# Patient Record
Sex: Male | Born: 1955 | Race: White | Hispanic: No | Marital: Single | Smoking: Never smoker
Health system: Southern US, Community
[De-identification: ages and names within clinical notes are randomized; demographics above are authoritative.]

## PROBLEM LIST (undated history)

## (undated) ENCOUNTER — Ambulatory Visit (HOSPITAL_COMMUNITY): Discharge status: Absent without leave | Payer: BLUE CROSS/BLUE SHIELD

## (undated) DIAGNOSIS — E785 Hyperlipidemia, unspecified: Secondary | ICD-10-CM

## (undated) DIAGNOSIS — C61 Malignant neoplasm of prostate: Secondary | ICD-10-CM

## (undated) DIAGNOSIS — T7840XA Allergy, unspecified, initial encounter: Secondary | ICD-10-CM

## (undated) DIAGNOSIS — I10 Essential (primary) hypertension: Secondary | ICD-10-CM

## (undated) HISTORY — DX: Essential (primary) hypertension: I10

## (undated) HISTORY — DX: Hyperlipidemia, unspecified: E78.5

## (undated) HISTORY — DX: Malignant neoplasm of prostate: C61

## (undated) HISTORY — DX: Allergy, unspecified, initial encounter: T78.40XA

---

## 2000-05-29 ENCOUNTER — Emergency Department (HOSPITAL_COMMUNITY): Admission: EM | Admit: 2000-05-29 | Discharge: 2000-05-29 | Payer: Self-pay | Admitting: Emergency Medicine

## 2000-05-29 ENCOUNTER — Encounter: Payer: Self-pay | Admitting: Emergency Medicine

## 2000-05-31 ENCOUNTER — Emergency Department (HOSPITAL_COMMUNITY): Admission: EM | Admit: 2000-05-31 | Discharge: 2000-05-31 | Payer: Self-pay | Admitting: Emergency Medicine

## 2000-06-06 ENCOUNTER — Emergency Department (HOSPITAL_COMMUNITY): Admission: EM | Admit: 2000-06-06 | Discharge: 2000-06-06 | Payer: Self-pay | Admitting: Emergency Medicine

## 2000-06-18 ENCOUNTER — Encounter: Admission: RE | Admit: 2000-06-18 | Discharge: 2000-06-24 | Payer: Self-pay | Admitting: Orthopedic Surgery

## 2008-11-21 ENCOUNTER — Encounter: Admission: RE | Admit: 2008-11-21 | Discharge: 2008-11-21 | Payer: Self-pay | Admitting: *Deleted

## 2010-11-23 ENCOUNTER — Encounter: Payer: Self-pay | Admitting: *Deleted

## 2011-06-22 DIAGNOSIS — C61 Malignant neoplasm of prostate: Secondary | ICD-10-CM

## 2011-06-22 HISTORY — PX: PROSTATE BIOPSY: SHX241

## 2011-06-22 HISTORY — DX: Malignant neoplasm of prostate: C61

## 2013-02-07 HISTORY — PX: PROSTATE BIOPSY: SHX241

## 2013-02-21 ENCOUNTER — Encounter: Payer: Self-pay | Admitting: *Deleted

## 2013-02-21 ENCOUNTER — Encounter: Payer: Self-pay | Admitting: Radiation Oncology

## 2013-02-21 NOTE — Progress Notes (Signed)
Radiation Oncology         [phone] ________________________________  Initial outpatient Consultation  Name: Jose Calderon MRN: [id number]  Date: 02/22/2013  DOB: 11/03/1955  CC:No primary provider on file.  Garnett Farm, MD   REFERRING PHYSICIAN: Garnett Farm, MD  DIAGNOSIS: 57 y.o. gentleman with stage T2a adenocarcinoma of the prostate with a Gleason's score of 3+3 and a PSA of 1.7  HISTORY OF PRESENT ILLNESS::Jose Calderon is a 57 y.o. gentleman.  He was noted to have an elevated PSA of 1.93 in 2010 rising to 2.08 one year later. By August of 2012, his PSA increased to 6.74. In addition to his rising PSA, patient was noted to have firmness in the right side of the gland by his primary care physician, Dr. Selena Batten.  Accordingly, he was referred for evaluation in urology by Dr. Vernie Ammons   The patient proceeded to transrectal ultrasound with 12 biopsies of the prostate on 06/22/2011.  The prostate volume measured 23.4 cc.  Out of 12 core biopsies, a single one was positive.  The Gleason score was 3+3, and this was seen in 5% of the right base.  The patient reviewed the biopsy results with urology and elected to pursue active surveillance.  Followup PSA on 01/02/2013 was 1.7.  The patient proceeded to transrectal ultrasound with surveillance biopsies of the prostate on 02/07/2013.  The prostate volume measured 26.82 cc.  Out of 12 core biopsies, 3 were positive.  The maximum Gleason score was 3+3, and this was seen in 20% of the right lateral base, 5% of the right base, and 5% of the left lateral mid.  The patient reviewed the biopsy results with his urologist and he has kindly been referred today for discussion of potential radiation treatment options.   PREVIOUS RADIATION THERAPY: No  PAST MEDICAL HISTORY:  has a past medical history of Prostate cancer (06/22/11); Hypertension; Hyperlipidemia; and Allergy.    PAST SURGICAL HISTORY: Past Surgical History  Procedure Laterality Date    . Prostate biopsy  06/22/11    Adenocarcinoma  . Prostate biopsy  02/07/13    2nd biopsy=Adenocarcinoma    FAMILY HISTORY: family history includes Cancer in his brother and mother.  SOCIAL HISTORY:  reports that he has never smoked. He has never used smokeless tobacco. He reports that  drinks alcohol. He reports that he does not use illicit drugs.  ALLERGIES: Amlodipine besy-benazepril hcl  MEDICATIONS:  Current Outpatient Prescriptions  Medication Sig Dispense Refill  . valACYclovir (VALTREX) 1000 MG tablet Take 1,000 mg by mouth as needed (cold sores).      Marland Kitchen HYDROXYZINE HCL PO Take 25 mg by mouth as needed (hives).        No current facility-administered medications for this encounter.    REVIEW OF SYSTEMS:  A 15 point review of systems is documented in the electronic medical record. This was obtained by the nursing staff. However, I reviewed this with the patient to discuss relevant findings and make appropriate changes.  A comprehensive review of systems was negative..  The patient completed an IPSS and IIEF questionnaire.  His IPSS score was 5 indicating mild urinary outflow obstructive symptoms.  He indicated that his erectile function is almost always able to complete sexual activity.   PHYSICAL EXAM: This patient is in no acute distress.  He is alert and oriented.   height is 5\' 10"  (1.778 m) and weight is 202 lb 11.2 oz (91.944 kg). His oral temperature is 97.9  F (36.6 C). His blood pressure is 162/89 and his pulse is 76. His respiration is 20.  He exhibits no respiratory distress or labored breathing.  He appears neurologically intact.  His mood is pleasant.  His affect is appropriate.  Please note the digital rectal exam findings described above.  LABORATORY DATA:  No results found for this basename: WBC,  HGB,  HCT,  MCV,  PLT   No results found for this basename: NA,  K,  CL,  CO2   No results found for this basename: ALT,  AST,  GGT,  ALKPHOS,  BILITOT      RADIOGRAPHY: No results found.    IMPRESSION: This gentleman is a 57 y.o. gentleman with stage T2a adenocarcinoma of the prostate with a Gleason's score of 3+3 and a PSA of 1.7.  His T-Stage, Gleason's Score, and PSA put him into the favorable risk group.  Accordingly he is eligible for a variety of potential treatment options including robotic-assisted laparoscopic radical prostatectomy, external beam radiation therapy in the form of IM RT, and prostate seed implant as monotherapy.  PLAN:Today I reviewed the findings and workup thus far.  We discussed the natural history of prostate cancer.  We reviewed the the implications of T-stage, Gleason's Score, and PSA on decision-making and outcomes in prostate cancer.  We discussed radiation treatment in the management of prostate cancer with regard to the logistics and delivery of external beam radiation treatment as well as the logistics and delivery of prostate brachytherapy.  We compared and contrasted each of these approaches and also compared these against prostatectomy.  The patient expressed some interest in prostate brachytherapy vs. prostatectomy.  I filled out a patient counseling form for him with relevant treatment diagrams and we retained a copy for our records.   The patient remains undecided and will call if he has more questions or decides to proceed with seed implant.  I will share my findings with Dr. Vernie Ammons.     I enjoyed meeting with him today, and will look forward to participating in the care of this very nice gentleman.   I spent 60 minutes face to face with the patient and more than 50% of that time was spent in counseling and/or coordination of care.   ------------------------------------------------  Artist Pais. Kathrynn Running, M.D.

## 2013-02-21 NOTE — Progress Notes (Unsigned)
GU Location of Tumor / Histology: Prostate Biopsy 06/22/11 1st biopsy,2nd biopsy 02/08/13,Adenocarcinoma  If Prostate Cancer,  8/20/12Gleason Score is (3 + 3) and PSA is (6.74),, Volume=23.4cc, 2nd Biopsy 02/07/13 Gleason=3+3=6,PSA=1.70 volume=27cc  Patient presented months ago with signs/symptoms of: elevated PSA 7/31/212,   Biopsies of Prostate  revealed: Adenocarcinoma  Past/Anticipated interventions by urology, if any: active surveillance at present  Past/Anticipated interventions by medical oncology, if any: Interested in seed implantation Weight changes, if any: NO  Bowel/Bladder complaints, if any:  No,,gets up at night 1-2 x night only  Nausea/Vomiting, if any:No Pain issues, if any:  NO  SAFETY ISSUES:   Pacemaker/ICD? NO  Possible current pregnancy?N/A Is the patient on methotrexate?NO Current Complaints / other details:

## 2013-02-22 ENCOUNTER — Ambulatory Visit
Admission: RE | Admit: 2013-02-22 | Discharge: 2013-02-22 | Disposition: A | Discharge status: Home / Self Care | Payer: BC Managed Care – PPO | Source: Ambulatory Visit | Attending: Radiation Oncology | Admitting: Radiation Oncology

## 2013-02-22 ENCOUNTER — Encounter: Payer: Self-pay | Admitting: Radiation Oncology

## 2013-02-22 VITALS — BP 162/89 | HR 76 | Temp 97.9°F | Resp 20 | Ht 70.0 in | Wt 202.7 lb

## 2013-02-22 DIAGNOSIS — C61 Malignant neoplasm of prostate: Secondary | ICD-10-CM | POA: Insufficient documentation

## 2013-02-22 DIAGNOSIS — I10 Essential (primary) hypertension: Secondary | ICD-10-CM | POA: Insufficient documentation

## 2013-02-22 DIAGNOSIS — E785 Hyperlipidemia, unspecified: Secondary | ICD-10-CM | POA: Insufficient documentation

## 2013-02-22 NOTE — Progress Notes (Signed)
Please see the Nurse Progress Note in the MD Initial Consult Encounter for this patient. 

## 2013-02-22 NOTE — Progress Notes (Signed)
New Consult  Prostate cancer, ambulatory,steady gait, alert,oriented x3, Single, no children, regular bowels, nocturia 1-2x night, I-PSS score=5, brother had radical prostatectomy 3 years ago age 58, living doing well, 2:14 PM

## 2013-08-02 ENCOUNTER — Ambulatory Visit (INDEPENDENT_AMBULATORY_CARE_PROVIDER_SITE_OTHER): Payer: BC Managed Care – PPO | Admitting: *Deleted

## 2013-08-02 VITALS — BP 160/100 | HR 88 | Temp 98.2°F | Resp 18 | Ht 68.0 in | Wt 196.2 lb

## 2013-08-02 DIAGNOSIS — Z0489 Encounter for examination and observation for other specified reasons: Secondary | ICD-10-CM

## 2013-08-02 DIAGNOSIS — Z23 Encounter for immunization: Secondary | ICD-10-CM

## 2013-08-16 NOTE — Progress Notes (Signed)
Personal drug screen and Flu vaccine only. Not seen by provider.

## 2014-10-24 ENCOUNTER — Other Ambulatory Visit: Payer: Self-pay | Admitting: Internal Medicine

## 2014-10-24 DIAGNOSIS — R740 Nonspecific elevation of levels of transaminase and lactic acid dehydrogenase [LDH]: Principal | ICD-10-CM

## 2014-10-24 DIAGNOSIS — R7401 Elevation of levels of liver transaminase levels: Secondary | ICD-10-CM

## 2014-10-24 DIAGNOSIS — R7402 Elevation of levels of lactic acid dehydrogenase (LDH): Secondary | ICD-10-CM

## 2014-11-14 ENCOUNTER — Ambulatory Visit
Admission: RE | Admit: 2014-11-14 | Discharge: 2014-11-14 | Disposition: A | Discharge status: Home / Self Care | Payer: BLUE CROSS/BLUE SHIELD | Source: Ambulatory Visit | Attending: Internal Medicine | Admitting: Internal Medicine

## 2014-11-14 DIAGNOSIS — R740 Nonspecific elevation of levels of transaminase and lactic acid dehydrogenase [LDH]: Principal | ICD-10-CM

## 2014-11-14 DIAGNOSIS — R7401 Elevation of levels of liver transaminase levels: Secondary | ICD-10-CM

## 2014-11-14 DIAGNOSIS — R7402 Elevation of levels of lactic acid dehydrogenase (LDH): Secondary | ICD-10-CM

## 2015-09-24 ENCOUNTER — Ambulatory Visit (INDEPENDENT_AMBULATORY_CARE_PROVIDER_SITE_OTHER): Payer: BLUE CROSS/BLUE SHIELD

## 2015-09-24 ENCOUNTER — Ambulatory Visit (INDEPENDENT_AMBULATORY_CARE_PROVIDER_SITE_OTHER): Payer: BLUE CROSS/BLUE SHIELD | Admitting: Family Medicine

## 2015-09-24 VITALS — BP 132/70 | HR 92 | Temp 100.0°F | Resp 16 | Ht 68.0 in | Wt 199.6 lb

## 2015-09-24 DIAGNOSIS — R059 Cough, unspecified: Secondary | ICD-10-CM

## 2015-09-24 DIAGNOSIS — R509 Fever, unspecified: Secondary | ICD-10-CM

## 2015-09-24 DIAGNOSIS — J189 Pneumonia, unspecified organism: Secondary | ICD-10-CM

## 2015-09-24 DIAGNOSIS — R05 Cough: Secondary | ICD-10-CM

## 2015-09-24 LAB — POCT CBC
Granulocyte percent: 83.9 %G — AB (ref 37–80)
HCT, POC: 41.8 % — AB (ref 43.5–53.7)
HEMOGLOBIN: 14.6 g/dL (ref 14.1–18.1)
Lymph, poc: 2.1 (ref 0.6–3.4)
MCH: 33 pg — AB (ref 27–31.2)
MCHC: 34.8 g/dL (ref 31.8–35.4)
MCV: 94.9 fL (ref 80–97)
MID (CBC): 0.7 (ref 0–0.9)
MPV: 7.3 fL (ref 0–99.8)
PLATELET COUNT, POC: 222 10*3/uL (ref 142–424)
POC Granulocyte: 14.3 — AB (ref 2–6.9)
POC LYMPH PERCENT: 12.1 %L (ref 10–50)
POC MID %: 4 %M (ref 0–12)
RBC: 4.41 M/uL — AB (ref 4.69–6.13)
RDW, POC: 12.9 %
WBC: 17 10*3/uL — AB (ref 4.6–10.2)

## 2015-09-24 MED ORDER — CEFTRIAXONE SODIUM 1 G IJ SOLR
1.0000 g | Freq: Once | INTRAMUSCULAR | Status: AC
  Administered 2015-09-24: 1 g via INTRAMUSCULAR

## 2015-09-24 MED ORDER — BENZONATATE 100 MG PO CAPS: 100.0000 mg | ORAL_CAPSULE | Freq: Three times a day (TID) | ORAL | Status: DC | PRN

## 2015-09-24 MED ORDER — LEVOFLOXACIN 500 MG PO TABS
500.0000 mg | ORAL_TABLET | Freq: Every day | ORAL | Status: DC
Start: 2015-09-24 — End: 2015-10-14

## 2015-09-24 MED ORDER — HYDROCODONE-HOMATROPINE 5-1.5 MG/5ML PO SYRP: 5.0000 mL | ORAL_SOLUTION | ORAL | Status: DC | PRN

## 2015-09-24 NOTE — Progress Notes (Signed)
Patient ID: Jose Calderon, male    DOB: August 01, 1956  Age: 59 y.o. MRN: [id number]  Chief Complaint  Patient presents with  . Cough    x thurs.  . Joint Pain    x thurs.  . Fever    x thurs.  . Nasal Congestion    x thurs.    Subjective:   59 year old man who has a 5 day history of having had a cough. One evening just helped while he is having better. It has persisted since then. Today he started running a fever. He does not have flu shot this year. He has a history of getting similar infections every fall. He does not smoke. He carries doctor at home it was 100 this morning also.  Current allergies, medications, problem list, past/family and social histories reviewed.  Objective:  BP 132/70 mmHg  Pulse 92  Temp(Src) 100 F (37.8 C) (Oral)  Resp 16  Ht 5\' 8"  (1.727 m)  Wt 199 lb 9.6 oz (90.538 kg)  BMI 30.36 kg/m2  SpO2 98%  TMs are normal. Nose clear. No upper respiratory type congestion sounds. Throat is clear. Neck supple without significant nodes. Chest is coarse bilaterally. Does not have well-defined rhonchi or rales. Heart regular without murmur. He is warm to touch.  Assessment & Plan:   Assessment: 1. Cough   2. Fever, unspecified fever cause   3. CAP (community acquired pneumonia)       Plan: Chest x-ray and CBC.  Results for orders placed or performed in visit on 09/24/15  POCT CBC  Result Value Ref Range   WBC 17.0 (A) 4.6 - 10.2 K/uL   Lymph, poc 2.1 0.6 - 3.4   POC LYMPH PERCENT 12.1 10 - 50 %L   MID (cbc) 0.7 0 - 0.9   POC MID % 4.0 0 - 12 %M   POC Granulocyte 14.3 (A) 2 - 6.9   Granulocyte percent 83.9 (A) 37 - 80 %G   RBC 4.41 (A) 4.69 - 6.13 M/uL   Hemoglobin 14.6 14.1 - 18.1 g/dL   HCT, POC 41.8 (A) 43.5 - 53.7 %   MCV 94.9 80 - 97 fL   MCH, POC 33.0 (A) 27 - 31.2 pg   MCHC 34.8 31.8 - 35.4 g/dL   RDW, POC 12.9 %   Platelet Count, POC 222 142 - 424 K/uL   MPV 7.3 0 - 99.8 fL   [facility] reading (PRIMARY) by  Dr. Linna Darner RLL  pneumonia.    Patient Instructions  You have received a 1 g injection of ceftriaxone (Rocephin) in the clinic  Take Levaquin 500 mg 1 daily for 1 week for pneumonia  Take Hycodan cough syrup 1 teaspoon every 4-6 hours as needed for cough. This is best for nighttime cough because it does cause drowsiness.  Take benzonatate 1 or 2 pills 3 times daily as needed for daytime cough  Drink plenty of fluids and get enough rest  I advised he stay off work for several days  Return if not much improved by Thursday, sooner at any time if acutely worse. Go to the emergency room if we're closed     Return if symptoms worsen or fail to improve.   Claira Jeter, MD 09/24/2015

## 2015-09-24 NOTE — Patient Instructions (Signed)
You have received a 1 g injection of ceftriaxone (Rocephin) in the clinic  Take Levaquin 500 mg 1 daily for 1 week for pneumonia  Take Hycodan cough syrup 1 teaspoon every 4-6 hours as needed for cough. This is best for nighttime cough because it does cause drowsiness.  Take benzonatate 1 or 2 pills 3 times daily as needed for daytime cough  Drink plenty of fluids and get enough rest  I advised he stay off work for several days  Return if not much improved by Thursday, sooner at any time if acutely worse. Go to the emergency room if we're closed

## 2015-09-25 ENCOUNTER — Telehealth: Payer: Self-pay | Admitting: Family Medicine

## 2015-09-25 NOTE — Telephone Encounter (Signed)
Patient states that the Levaquin is causing him to vomit. Asked patient if the instructions stated for him to take the medication with food, he states that it only says to take with plenty of water. He states that he did take it with a lot of water and later had 3 cups of coffee. Please advise whether or not a different medicine can be used. [place].  [phone]

## 2015-09-27 ENCOUNTER — Other Ambulatory Visit: Payer: Self-pay | Admitting: Family Medicine

## 2015-09-27 NOTE — Telephone Encounter (Signed)
Patient is able to take the medicine so does not need anything differently at this time.

## 2015-10-14 ENCOUNTER — Ambulatory Visit (INDEPENDENT_AMBULATORY_CARE_PROVIDER_SITE_OTHER): Payer: BLUE CROSS/BLUE SHIELD | Admitting: Family Medicine

## 2015-10-14 ENCOUNTER — Ambulatory Visit (INDEPENDENT_AMBULATORY_CARE_PROVIDER_SITE_OTHER): Payer: BLUE CROSS/BLUE SHIELD

## 2015-10-14 VITALS — BP 122/64 | HR 70 | Temp 98.6°F | Resp 16 | Ht 68.0 in | Wt 196.6 lb

## 2015-10-14 DIAGNOSIS — R0989 Other specified symptoms and signs involving the circulatory and respiratory systems: Secondary | ICD-10-CM

## 2015-10-14 DIAGNOSIS — R062 Wheezing: Secondary | ICD-10-CM

## 2015-10-14 MED ORDER — PREDNISONE 20 MG PO TABS: ORAL_TABLET | ORAL | Status: DC

## 2015-10-14 MED ORDER — ALBUTEROL SULFATE HFA 108 (90 BASE) MCG/ACT IN AERS: 2.0000 | INHALATION_SPRAY | Freq: Four times a day (QID) | RESPIRATORY_TRACT | Status: DC | PRN

## 2015-10-14 NOTE — Progress Notes (Signed)
[facility] [address], [place] [address] [phone]  Date:  10/14/2015   Name:  Jose Calderon   DOB:  1956-10-12   MRN:  [id number]  PCP:  No primary care provider on file.    Chief Complaint: Follow-up   History of Present Illness:  Jose Calderon is a 59 y.o. very pleasant male patient who presents with the following:  Here today to recheck pneumonia that was noted on 11/22- positive CXR and leukocytosis.  He was treated with IM rocephin and po levaquin for one week His fever resolved, but he is still coughing some and bringing material up some of the time.  He will cough mostly in the am Wheezing some as well.   He otherwise feels ok.  Energy level is good He does have allergies Never been a smoker.   Does not use an inhaler but has done in the past  Patient Active Problem List   Diagnosis Date Noted  . Prostate cancer ([place]) 06/22/2011    Past Medical History  Diagnosis Date  . Prostate cancer ([place]) 06/22/11    Adenocarcinoma  . Hypertension   . Hyperlipidemia   . Allergy     amlodipine =rash    Past Surgical History  Procedure Laterality Date  . Prostate biopsy  06/22/11    Adenocarcinoma  . Prostate biopsy  02/07/13    2nd biopsy=Adenocarcinoma    Social History  Substance Use Topics  . Smoking status: Never Smoker   . Smokeless tobacco: Never Used  . Alcohol Use: Yes     Comment: beer  4-5 12 ounce cans daily    Family History  Problem Relation Age of Onset  . Cancer Brother     prostate /radical prostatectomy  . Cancer Mother     No Known Allergies  Medication list has been reviewed and updated.  Current Outpatient Prescriptions on File Prior to Visit  Medication Sig Dispense Refill  . benzonatate (TESSALON) 100 MG capsule Take 1-2 capsules (100-200 mg total) by mouth 3 (three) times daily as needed. 30 capsule 0  . chlorpheniramine (CHLOR-TRIMETON) 4 MG tablet Take 4 mg by mouth 2 (two) times daily as needed for  allergies.    Marland Kitchen HYDROcodone-homatropine (HYCODAN) 5-1.5 MG/5ML syrup Take 5 mLs by mouth every 4 (four) hours as needed. 120 mL 0  . levocetirizine (XYZAL) 5 MG tablet Take 5 mg by mouth every evening.    Marland Kitchen levofloxacin (LEVAQUIN) 500 MG tablet Take 1 tablet (500 mg total) by mouth daily. 7 tablet 0  . nebivolol (BYSTOLIC) 5 MG tablet Take 5 mg by mouth daily.    . valACYclovir (VALTREX) 1000 MG tablet Take 1,000 mg by mouth as needed (cold sores).    . zolpidem (AMBIEN) 5 MG tablet Take 5 mg by mouth at bedtime as needed for sleep.    Marland Kitchen HYDROXYZINE HCL PO Take 25 mg by mouth as needed (hives).      No current facility-administered medications on file prior to visit.    Review of Systems:  As per HPI- otherwise negative.   Physical Examination: Filed Vitals:   10/14/15 1303  BP: 122/64  Pulse: 70  Temp: 98.6 F (37 C)  Resp: 16   Filed Vitals:   10/14/15 1303  Height: 5\' 8"  (1.727 m)  Weight: 196 lb 9.6 oz (89.177 kg)   Body mass index is 29.9 kg/(m^2). Ideal Body Weight: Weight in (lb) to have BMI = 25:  164.1  GEN: WDWN, NAD, Non-toxic, A & O x 3, looks well HEENT: Atraumatic, Normocephalic. Neck supple. No masses, No LAD.  Bilateral TM wnl, oropharynx normal.  PEERL,EOMI.   Ears and Nose: No external deformity. CV: RRR, No M/G/R. No JVD. No thrill. No extra heart sounds. PULM: no crackles, mild bilateral rhonchi.  Mild wheezing,  Lungs are congested.  No retractions. No resp. distress. No accessory muscle use. EXTR: No c/c/e NEURO Normal gait.  PSYCH: Normally interactive. Conversant. Not depressed or anxious appearing.  Calm demeanor.   [facility] reading (PRIMARY) by  Dr. Lorelei Pont. CXR: negative- pneumonia is resolved.    CHEST 2 VIEW  COMPARISON: 09/24/2015  FINDINGS: Normal cardiac silhouette. No effusion, infiltrate, pneumothorax. Nipple shadows noted. Degenerative osteophytosis of the thoracic spine.  IMPRESSION: No acute cardiopulmonary  process.   Assessment and Plan: Chest congestion - Plan: DG Chest 2 View  Wheezing - Plan: DG Chest 2 View, predniSONE (DELTASONE) 20 MG tablet, albuterol (PROVENTIL HFA;VENTOLIN HFA) 108 (90 BASE) MCG/ACT inhaler  Here today with resolved pneumonia but chest congestion and cough.  CXR is cleared up Will treat with a short course of prednisone and albuterol as needed for wheezing He will let us know if not better   Signed Lamar Blinks, MD

## 2015-10-14 NOTE — Patient Instructions (Signed)
Your x-ray looks much better- I think it is clear,  I will let you know if the radiologist feels otherwise Use the prednisone as directed for 6 days, and the albuterol as needed  If you would like to get a pneumonia vaccine (prevnar) early that is fine- the only question is if your insurance will cover it at your age.  You can certainly call them and ask!  Let me know if you are not getting better over the next week or so- Sooner if worse.

## 2016-01-03 ENCOUNTER — Telehealth: Payer: Self-pay | Admitting: Gastroenterology

## 2016-01-03 NOTE — Telephone Encounter (Signed)
Received GI records and placed on Dr. Eugenia Pancoast desk for review. Dr. Jani Gravel is requesting that patient see Dr. Eugenia Pancoast

## 2016-01-07 NOTE — Telephone Encounter (Signed)
Records Received,REviewed and OK'd to schedule a New Pt OV w/Dr Ardis Hughs. Called pt's cell phone and left vmail to call back & schedule.

## 2016-01-13 ENCOUNTER — Encounter: Payer: Self-pay | Admitting: Gastroenterology

## 2016-03-03 ENCOUNTER — Encounter: Payer: Self-pay | Admitting: Gastroenterology

## 2016-03-03 ENCOUNTER — Ambulatory Visit (INDEPENDENT_AMBULATORY_CARE_PROVIDER_SITE_OTHER): Payer: BLUE CROSS/BLUE SHIELD | Admitting: Gastroenterology

## 2016-03-03 VITALS — BP 140/82 | HR 66 | Ht 68.0 in | Wt 197.0 lb

## 2016-03-03 DIAGNOSIS — Z1211 Encounter for screening for malignant neoplasm of colon: Secondary | ICD-10-CM

## 2016-03-03 MED ORDER — NA SULFATE-K SULFATE-MG SULF 17.5-3.13-1.6 GM/177ML PO SOLN: 1.0000 | Freq: Once | ORAL | Status: DC

## 2016-03-03 NOTE — Progress Notes (Signed)
HPI: This is a  very pleasant 60 year old man    who was referred to me by Jani Gravel M.D. to evaluate  colon cancer screening, previous incomplete colonoscopy .    Chief complaint is routine risk for colon cancer, previous incomplete colonoscopy  Incomplete colonscopy Dr. Lajoyce Corners: 2010 (twisty sigmoid)  2010 B Enema: 1. Multiple diverticula within the rectosigmoid and descending colon. 2. Some retained feces but no persistent polypoid lesion or constricting lesion is seen. Consider virtual colonoscopy for future assessment of the colon if optical colonoscopy cannot be Performed.  Prostate surgery; 2015 [facility]  No FH of colon cancer.  No bowel troubles.  Weight stable.    Review of systems: Pertinent positive and negative review of systems were noted in the above HPI section. Complete review of systems was performed and was otherwise normal.   Past Medical History  Diagnosis Date  . Prostate cancer ([place]) 06/22/11    Adenocarcinoma  . Hypertension   . Hyperlipidemia   . Allergy     amlodipine =rash    Past Surgical History  Procedure Laterality Date  . Prostate biopsy  06/22/11    Adenocarcinoma  . Prostate biopsy  02/07/13    2nd biopsy=Adenocarcinoma    Current Outpatient Prescriptions  Medication Sig Dispense Refill  . benzonatate (TESSALON) 100 MG capsule Take 1-2 capsules (100-200 mg total) by mouth 3 (three) times daily as needed. 30 capsule 0  . chlorpheniramine (CHLOR-TRIMETON) 4 MG tablet Take 4 mg by mouth 2 (two) times daily as needed for allergies.    Marland Kitchen HYDROXYZINE HCL PO Take 25 mg by mouth as needed (hives).     . methocarbamol (ROBAXIN) 500 MG tablet Take 500 mg by mouth 4 (four) times daily.    . nebivolol (BYSTOLIC) 5 MG tablet Take 5 mg by mouth daily.    . valACYclovir (VALTREX) 1000 MG tablet Take 1,000 mg by mouth as needed (cold sores).    . zolpidem (AMBIEN) 5 MG tablet Take 5 mg by mouth at bedtime as needed for sleep.     No current  facility-administered medications for this visit.    Allergies as of 03/03/2016  . (No Known Allergies)    Family History  Problem Relation Age of Onset  . Cancer Brother     prostate /radical prostatectomy  . Cancer Mother     Social History   Social History  . Marital Status: Single    Spouse Name: N/A  . Number of Children: N/A  . Years of Education: N/A   Occupational History  .      Camera operator   Social History Main Topics  . Smoking status: Never Smoker   . Smokeless tobacco: Never Used  . Alcohol Use: Yes     Comment: beer  4-5 12 ounce cans daily  . Drug Use: No  . Sexual Activity: Yes   Other Topics Concern  . Not on file   Social History Narrative     Physical Exam: BP 140/82 mmHg  Pulse 66  Ht 5\' 8"  (1.727 m)  Wt 197 lb (89.359 kg)  BMI 29.96 kg/m2 Constitutional: generally well-appearing Psychiatric: alert and oriented x3 Eyes: extraocular movements intact Mouth: oral pharynx moist, no lesions Neck: supple no lymphadenopathy Cardiovascular: heart regular rate and rhythm Lungs: clear to auscultation bilaterally Abdomen: soft, nontender, nondistended, no obvious ascites, no peritoneal signs, normal bowel sounds Extremities: no lower extremity edema bilaterally Skin: no lesions on visible extremities   Assessment and plan: 60  y.o. male with  Routine risk for colon cancer, previous incomplete colonoscopy by another provider in 2010  I explained to him that he has options for colon cancer screening. Colo guard is becoming more and more recognized tool for colon cancer screening. However colonoscopy is still considered occult standard. He did have incomplete colonoscopy due to very twisty sigmoid colon about 7 years ago and I explained to him that that probably puts him at somewhat increased risk for another incomplete colonoscopy now. I do however think there is still a very good chance that he could have a complete examination at this time  around. He understands and agreed to proceed with repeat colonoscopy at his soonest convenience.   Owens Loffler, MD [facility] 03/03/2016, 9:14 AM    Cc: Jani Gravel, MD

## 2016-03-03 NOTE — Patient Instructions (Signed)
You will be set up for a colonoscopy for colon cancer screening. 

## 2016-03-20 ENCOUNTER — Encounter: Payer: Self-pay | Admitting: Gastroenterology

## 2016-04-03 ENCOUNTER — Ambulatory Visit (AMBULATORY_SURGERY_CENTER): Payer: BLUE CROSS/BLUE SHIELD | Admitting: Gastroenterology

## 2016-04-03 ENCOUNTER — Encounter: Payer: Self-pay | Admitting: Gastroenterology

## 2016-04-03 VITALS — BP 118/91 | HR 73 | Temp 98.4°F | Resp 14 | Ht 68.0 in | Wt 197.0 lb

## 2016-04-03 DIAGNOSIS — Z1211 Encounter for screening for malignant neoplasm of colon: Secondary | ICD-10-CM

## 2016-04-03 DIAGNOSIS — D122 Benign neoplasm of ascending colon: Secondary | ICD-10-CM

## 2016-04-03 MED ORDER — SODIUM CHLORIDE 0.9 % IV SOLN: 500.0000 mL | INTRAVENOUS | Status: DC

## 2016-04-03 NOTE — Progress Notes (Signed)
Report to PACU, RN, vss, BBS= Clear.  

## 2016-04-03 NOTE — Patient Instructions (Signed)
YOU HAD AN ENDOSCOPIC PROCEDURE TODAY AT [facility]:   Refer to the procedure report that was given to you for any specific questions about what was found during the examination.  If the procedure report does not answer your questions, please call your gastroenterologist to clarify.  If you requested that your care partner not be given the details of your procedure findings, then the procedure report has been included in a sealed envelope for you to review at your convenience later.  YOU SHOULD EXPECT: Some feelings of bloating in the abdomen. Passage of more gas than usual.  Walking can help get rid of the air that was put into your GI tract during the procedure and reduce the bloating. If you had a lower endoscopy (such as a colonoscopy or flexible sigmoidoscopy) you may notice spotting of blood in your stool or on the toilet paper. If you underwent a bowel prep for your procedure, you may not have a normal bowel movement for a few days.  Please Note:  You might notice some irritation and congestion in your nose or some drainage.  This is from the oxygen used during your procedure.  There is no need for concern and it should clear up in a day or so.  SYMPTOMS TO REPORT IMMEDIATELY:   Following lower endoscopy (colonoscopy or flexible sigmoidoscopy):  Excessive amounts of blood in the stool  Significant tenderness or worsening of abdominal pains  Swelling of the abdomen that is new, acute  Fever of 100F or higher   For urgent or emergent issues, a gastroenterologist can be reached at any hour by calling [phone].   DIET: Your first meal following the procedure should be a small meal and then it is ok to progress to your normal diet. Heavy or fried foods are harder to digest and may make you feel nauseous or bloated.  Likewise, meals heavy in dairy and vegetables can increase bloating.  Drink plenty of fluids but you should avoid alcoholic beverages for 24 hours. Try to  increase the fiber in your diet due to Diverticulosis.  ACTIVITY:  You should plan to take it easy for the rest of today and you should NOT DRIVE or use heavy machinery until tomorrow (because of the sedation medicines used during the test).    FOLLOW UP: Our staff will call the number listed on your records the next business day following your procedure to check on you and address any questions or concerns that you may have regarding the information given to you following your procedure. If we do not reach you, we will leave a message.  However, if you are feeling well and you are not experiencing any problems, there is no need to return our call.  We will assume that you have returned to your regular daily activities without incident.  If any biopsies were taken you will be contacted by phone or by letter within the next 1-3 weeks.  Please call us at [phone] if you have not heard about the biopsies in 3 weeks.    SIGNATURES/CONFIDENTIALITY: You and/or your care partner have signed paperwork which will be entered into your electronic medical record.  These signatures attest to the fact that that the information above on your After Visit Summary has been reviewed and is understood.  Full responsibility of the confidentiality of this discharge information lies with you and/or your care-partner.  Read all of the handouts given to you by your recovery room nurse.  Thank-you for choosing [place] for your healthcare needs today.

## 2016-04-03 NOTE — Progress Notes (Signed)
Called to room to assist during endoscopic procedure.  Patient ID and intended procedure confirmed with present staff. Received instructions for my participation in the procedure from the performing physician.  

## 2016-04-03 NOTE — Op Note (Signed)
[facility] Patient Name: Jose Calderon Procedure Date: 04/03/2016 1:39 PM MRN: [id number] Endoscopist: Milus Banister , MD Age: 60 Referring MD:  Date of Birth: [birth year] Gender: Male Procedure:                Colonoscopy Indications:              Screening for colorectal malignant neoplasm                            (incomplete colonsocopy several years ago Dr. Lajoyce Corners,                            follow up BE showed no polyps, cancers) Medicines:                Monitored Anesthesia Care Procedure:                Pre-Anesthesia Assessment:                           - Prior to the procedure, a History and Physical                            was performed, and patient medications and                            allergies were reviewed. The patient's tolerance of                            previous anesthesia was also reviewed. The risks                            and benefits of the procedure and the sedation                            options and risks were discussed with the patient.                            All questions were answered, and informed consent                            was obtained. Prior Anticoagulants: The patient has                            taken no previous anticoagulant or antiplatelet                            agents. ASA Grade Assessment: II - A patient with                            mild systemic disease. After reviewing the risks                            and benefits, the patient was deemed in  satisfactory condition to undergo the procedure.                           After obtaining informed consent, the colonoscope                            was passed under direct vision. Throughout the                            procedure, the patient's blood pressure, pulse, and                            oxygen saturations were monitored continuously. The                            Model CF-HQ190L [id number]) scope was introduced                            through the anus and advanced to the the cecum,                            identified by appendiceal orifice and ileocecal                            valve. The colonoscopy was performed without                            difficulty. The patient tolerated the procedure                            well. The quality of the bowel preparation was                            good. The ileocecal valve, appendiceal orifice, and                            rectum were photographed. Scope In: 1:42:57 PM Scope Out: 2:00:01 PM Scope Withdrawal Time: 0 hours 12 minutes 31 seconds  Total Procedure Duration: 0 hours 17 minutes 4 seconds  Findings:                 Three sessile polyps were found in the ascending                            colon. The polyps were 2 to 5 mm in size. These                            polyps were removed with a cold snare. Resection                            was complete, but the polyp tissue was only                            partially retrieved.  Multiple small and large-mouthed diverticula were                            found in the entire colon.                           The exam was otherwise without abnormality on                            direct and retroflexion views. Complications:            No immediate complications. Estimated blood loss:                            None. Estimated Blood Loss:     Estimated blood loss: none. Impression:               - Three 2 to 5 mm polyps in the ascending colon,                            removed with a cold snare. Complete resection.                            Partial retrieval (two of the three polyps were                            retrieved).                           - Diverticulosis in the entire examined colon.                           - The examination was otherwise normal on direct                            and retroflexion views. Recommendation:           - Patient  has a contact number available for                            emergencies. The signs and symptoms of potential                            delayed complications were discussed with the                            patient. Return to normal activities tomorrow.                            Written discharge instructions were provided to the                            patient.                           - Resume previous diet.                           -  Continue present medications.                           You will receive a letter within 2-3 weeks with the                            pathology results and my final recommendations.                           If the polyp(s) is proven to be 'pre-cancerous' on                            pathology, you will need repeat colonoscopy in 5                            years. If the polyp(s) is NOT 'precancerous' on                            pathology then you should repeat colon cancer                            screening in 10 years with colonoscopy without need                            for colon cancer screening by any method prior to                            then (including stool testing). Milus Banister, MD 04/03/2016 2:00:57 PM This report has been signed electronically.

## 2016-04-06 ENCOUNTER — Telehealth: Payer: Self-pay

## 2016-04-06 NOTE — Telephone Encounter (Signed)
  Follow up Call-  Call back number 04/03/2016  Post procedure Call Back phone  # [phone]  Permission to leave phone message Yes     Patient questions:  Do you have a fever, pain , or abdominal swelling? No. Pain Score  0 *  Have you tolerated food without any problems? Yes.    Have you been able to return to your normal activities? Yes.    Do you have any questions about your discharge instructions: Diet   No. Medications  No. Follow up visit  No.  Do you have questions or concerns about your Care? No.  Actions: * If pain score is 4 or above: No action needed, pain <4.

## 2016-04-10 ENCOUNTER — Encounter: Payer: Self-pay | Admitting: Gastroenterology

## 2016-05-18 DIAGNOSIS — J3081 Allergic rhinitis due to animal (cat) (dog) hair and dander: Secondary | ICD-10-CM | POA: Diagnosis not present

## 2016-05-18 DIAGNOSIS — R05 Cough: Secondary | ICD-10-CM | POA: Diagnosis not present

## 2016-05-18 DIAGNOSIS — J301 Allergic rhinitis due to pollen: Secondary | ICD-10-CM | POA: Diagnosis not present

## 2016-05-18 DIAGNOSIS — J3089 Other allergic rhinitis: Secondary | ICD-10-CM | POA: Diagnosis not present

## 2016-05-21 DIAGNOSIS — J301 Allergic rhinitis due to pollen: Secondary | ICD-10-CM | POA: Diagnosis not present

## 2016-05-22 DIAGNOSIS — J3081 Allergic rhinitis due to animal (cat) (dog) hair and dander: Secondary | ICD-10-CM | POA: Diagnosis not present

## 2016-05-22 DIAGNOSIS — J3089 Other allergic rhinitis: Secondary | ICD-10-CM | POA: Diagnosis not present

## 2016-05-26 DIAGNOSIS — J301 Allergic rhinitis due to pollen: Secondary | ICD-10-CM | POA: Diagnosis not present

## 2016-05-26 DIAGNOSIS — J3089 Other allergic rhinitis: Secondary | ICD-10-CM | POA: Diagnosis not present

## 2016-05-29 DIAGNOSIS — J3089 Other allergic rhinitis: Secondary | ICD-10-CM | POA: Diagnosis not present

## 2016-05-29 DIAGNOSIS — J301 Allergic rhinitis due to pollen: Secondary | ICD-10-CM | POA: Diagnosis not present

## 2016-06-08 DIAGNOSIS — J3089 Other allergic rhinitis: Secondary | ICD-10-CM | POA: Diagnosis not present

## 2016-06-08 DIAGNOSIS — J301 Allergic rhinitis due to pollen: Secondary | ICD-10-CM | POA: Diagnosis not present

## 2016-06-15 DIAGNOSIS — J301 Allergic rhinitis due to pollen: Secondary | ICD-10-CM | POA: Diagnosis not present

## 2016-06-15 DIAGNOSIS — J3089 Other allergic rhinitis: Secondary | ICD-10-CM | POA: Diagnosis not present

## 2016-06-19 DIAGNOSIS — J3089 Other allergic rhinitis: Secondary | ICD-10-CM | POA: Diagnosis not present

## 2016-06-19 DIAGNOSIS — J301 Allergic rhinitis due to pollen: Secondary | ICD-10-CM | POA: Diagnosis not present

## 2016-06-30 DIAGNOSIS — J301 Allergic rhinitis due to pollen: Secondary | ICD-10-CM | POA: Diagnosis not present

## 2016-06-30 DIAGNOSIS — J3089 Other allergic rhinitis: Secondary | ICD-10-CM | POA: Diagnosis not present

## 2016-07-03 DIAGNOSIS — J301 Allergic rhinitis due to pollen: Secondary | ICD-10-CM | POA: Diagnosis not present

## 2016-07-03 DIAGNOSIS — J3089 Other allergic rhinitis: Secondary | ICD-10-CM | POA: Diagnosis not present

## 2016-07-07 DIAGNOSIS — J3089 Other allergic rhinitis: Secondary | ICD-10-CM | POA: Diagnosis not present

## 2016-07-07 DIAGNOSIS — J301 Allergic rhinitis due to pollen: Secondary | ICD-10-CM | POA: Diagnosis not present

## 2016-07-10 DIAGNOSIS — J301 Allergic rhinitis due to pollen: Secondary | ICD-10-CM | POA: Diagnosis not present

## 2016-07-10 DIAGNOSIS — J3089 Other allergic rhinitis: Secondary | ICD-10-CM | POA: Diagnosis not present

## 2016-07-13 DIAGNOSIS — J301 Allergic rhinitis due to pollen: Secondary | ICD-10-CM | POA: Diagnosis not present

## 2016-07-13 DIAGNOSIS — J3089 Other allergic rhinitis: Secondary | ICD-10-CM | POA: Diagnosis not present

## 2016-07-17 DIAGNOSIS — J3089 Other allergic rhinitis: Secondary | ICD-10-CM | POA: Diagnosis not present

## 2016-07-17 DIAGNOSIS — J301 Allergic rhinitis due to pollen: Secondary | ICD-10-CM | POA: Diagnosis not present

## 2016-07-27 DIAGNOSIS — J3089 Other allergic rhinitis: Secondary | ICD-10-CM | POA: Diagnosis not present

## 2016-07-27 DIAGNOSIS — J301 Allergic rhinitis due to pollen: Secondary | ICD-10-CM | POA: Diagnosis not present

## 2016-07-31 DIAGNOSIS — J301 Allergic rhinitis due to pollen: Secondary | ICD-10-CM | POA: Diagnosis not present

## 2016-07-31 DIAGNOSIS — J3089 Other allergic rhinitis: Secondary | ICD-10-CM | POA: Diagnosis not present

## 2016-07-31 DIAGNOSIS — J3081 Allergic rhinitis due to animal (cat) (dog) hair and dander: Secondary | ICD-10-CM | POA: Diagnosis not present

## 2016-08-03 DIAGNOSIS — J3081 Allergic rhinitis due to animal (cat) (dog) hair and dander: Secondary | ICD-10-CM | POA: Diagnosis not present

## 2016-08-03 DIAGNOSIS — J3089 Other allergic rhinitis: Secondary | ICD-10-CM | POA: Diagnosis not present

## 2016-08-03 DIAGNOSIS — J301 Allergic rhinitis due to pollen: Secondary | ICD-10-CM | POA: Diagnosis not present

## 2016-08-11 DIAGNOSIS — J3081 Allergic rhinitis due to animal (cat) (dog) hair and dander: Secondary | ICD-10-CM | POA: Diagnosis not present

## 2016-08-11 DIAGNOSIS — J3089 Other allergic rhinitis: Secondary | ICD-10-CM | POA: Diagnosis not present

## 2016-08-11 DIAGNOSIS — J301 Allergic rhinitis due to pollen: Secondary | ICD-10-CM | POA: Diagnosis not present

## 2016-08-14 DIAGNOSIS — J3081 Allergic rhinitis due to animal (cat) (dog) hair and dander: Secondary | ICD-10-CM | POA: Diagnosis not present

## 2016-08-14 DIAGNOSIS — J3089 Other allergic rhinitis: Secondary | ICD-10-CM | POA: Diagnosis not present

## 2016-08-14 DIAGNOSIS — J301 Allergic rhinitis due to pollen: Secondary | ICD-10-CM | POA: Diagnosis not present

## 2016-08-17 DIAGNOSIS — J301 Allergic rhinitis due to pollen: Secondary | ICD-10-CM | POA: Diagnosis not present

## 2016-08-17 DIAGNOSIS — J3081 Allergic rhinitis due to animal (cat) (dog) hair and dander: Secondary | ICD-10-CM | POA: Diagnosis not present

## 2016-08-17 DIAGNOSIS — J3089 Other allergic rhinitis: Secondary | ICD-10-CM | POA: Diagnosis not present

## 2016-08-21 DIAGNOSIS — J301 Allergic rhinitis due to pollen: Secondary | ICD-10-CM | POA: Diagnosis not present

## 2016-08-21 DIAGNOSIS — J3089 Other allergic rhinitis: Secondary | ICD-10-CM | POA: Diagnosis not present

## 2016-08-21 DIAGNOSIS — J3081 Allergic rhinitis due to animal (cat) (dog) hair and dander: Secondary | ICD-10-CM | POA: Diagnosis not present

## 2016-08-24 DIAGNOSIS — J3081 Allergic rhinitis due to animal (cat) (dog) hair and dander: Secondary | ICD-10-CM | POA: Diagnosis not present

## 2016-08-24 DIAGNOSIS — J301 Allergic rhinitis due to pollen: Secondary | ICD-10-CM | POA: Diagnosis not present

## 2016-08-24 DIAGNOSIS — J3089 Other allergic rhinitis: Secondary | ICD-10-CM | POA: Diagnosis not present

## 2016-08-25 DIAGNOSIS — I10 Essential (primary) hypertension: Secondary | ICD-10-CM | POA: Diagnosis not present

## 2016-08-25 DIAGNOSIS — C61 Malignant neoplasm of prostate: Secondary | ICD-10-CM | POA: Diagnosis not present

## 2016-08-25 DIAGNOSIS — E784 Other hyperlipidemia: Secondary | ICD-10-CM | POA: Diagnosis not present

## 2016-08-25 DIAGNOSIS — R740 Nonspecific elevation of levels of transaminase and lactic acid dehydrogenase [LDH]: Secondary | ICD-10-CM | POA: Diagnosis not present

## 2016-08-28 DIAGNOSIS — J3081 Allergic rhinitis due to animal (cat) (dog) hair and dander: Secondary | ICD-10-CM | POA: Diagnosis not present

## 2016-08-28 DIAGNOSIS — J301 Allergic rhinitis due to pollen: Secondary | ICD-10-CM | POA: Diagnosis not present

## 2016-08-28 DIAGNOSIS — J3089 Other allergic rhinitis: Secondary | ICD-10-CM | POA: Diagnosis not present

## 2016-08-31 DIAGNOSIS — J3089 Other allergic rhinitis: Secondary | ICD-10-CM | POA: Diagnosis not present

## 2016-08-31 DIAGNOSIS — J301 Allergic rhinitis due to pollen: Secondary | ICD-10-CM | POA: Diagnosis not present

## 2016-08-31 DIAGNOSIS — J3081 Allergic rhinitis due to animal (cat) (dog) hair and dander: Secondary | ICD-10-CM | POA: Diagnosis not present

## 2016-09-01 DIAGNOSIS — Z23 Encounter for immunization: Secondary | ICD-10-CM | POA: Diagnosis not present

## 2016-09-01 DIAGNOSIS — I10 Essential (primary) hypertension: Secondary | ICD-10-CM | POA: Diagnosis not present

## 2016-09-01 DIAGNOSIS — C61 Malignant neoplasm of prostate: Secondary | ICD-10-CM | POA: Diagnosis not present

## 2016-09-03 DIAGNOSIS — L570 Actinic keratosis: Secondary | ICD-10-CM | POA: Diagnosis not present

## 2016-09-03 DIAGNOSIS — D485 Neoplasm of uncertain behavior of skin: Secondary | ICD-10-CM | POA: Diagnosis not present

## 2016-09-03 DIAGNOSIS — B078 Other viral warts: Secondary | ICD-10-CM | POA: Diagnosis not present

## 2016-09-03 DIAGNOSIS — L821 Other seborrheic keratosis: Secondary | ICD-10-CM | POA: Diagnosis not present

## 2016-09-03 DIAGNOSIS — L814 Other melanin hyperpigmentation: Secondary | ICD-10-CM | POA: Diagnosis not present

## 2016-09-03 DIAGNOSIS — D225 Melanocytic nevi of trunk: Secondary | ICD-10-CM | POA: Diagnosis not present

## 2016-09-04 DIAGNOSIS — J3081 Allergic rhinitis due to animal (cat) (dog) hair and dander: Secondary | ICD-10-CM | POA: Diagnosis not present

## 2016-09-04 DIAGNOSIS — J301 Allergic rhinitis due to pollen: Secondary | ICD-10-CM | POA: Diagnosis not present

## 2016-09-04 DIAGNOSIS — J3089 Other allergic rhinitis: Secondary | ICD-10-CM | POA: Diagnosis not present

## 2016-09-07 DIAGNOSIS — J3089 Other allergic rhinitis: Secondary | ICD-10-CM | POA: Diagnosis not present

## 2016-09-07 DIAGNOSIS — J3081 Allergic rhinitis due to animal (cat) (dog) hair and dander: Secondary | ICD-10-CM | POA: Diagnosis not present

## 2016-09-07 DIAGNOSIS — J301 Allergic rhinitis due to pollen: Secondary | ICD-10-CM | POA: Diagnosis not present

## 2016-09-10 DIAGNOSIS — J3081 Allergic rhinitis due to animal (cat) (dog) hair and dander: Secondary | ICD-10-CM | POA: Diagnosis not present

## 2016-09-10 DIAGNOSIS — J3089 Other allergic rhinitis: Secondary | ICD-10-CM | POA: Diagnosis not present

## 2016-09-10 DIAGNOSIS — J301 Allergic rhinitis due to pollen: Secondary | ICD-10-CM | POA: Diagnosis not present

## 2016-09-21 DIAGNOSIS — J3081 Allergic rhinitis due to animal (cat) (dog) hair and dander: Secondary | ICD-10-CM | POA: Diagnosis not present

## 2016-09-21 DIAGNOSIS — J301 Allergic rhinitis due to pollen: Secondary | ICD-10-CM | POA: Diagnosis not present

## 2016-09-21 DIAGNOSIS — J3089 Other allergic rhinitis: Secondary | ICD-10-CM | POA: Diagnosis not present

## 2016-09-28 DIAGNOSIS — J3089 Other allergic rhinitis: Secondary | ICD-10-CM | POA: Diagnosis not present

## 2016-09-28 DIAGNOSIS — J3081 Allergic rhinitis due to animal (cat) (dog) hair and dander: Secondary | ICD-10-CM | POA: Diagnosis not present

## 2016-09-28 DIAGNOSIS — J301 Allergic rhinitis due to pollen: Secondary | ICD-10-CM | POA: Diagnosis not present

## 2016-10-02 DIAGNOSIS — J3081 Allergic rhinitis due to animal (cat) (dog) hair and dander: Secondary | ICD-10-CM | POA: Diagnosis not present

## 2016-10-02 DIAGNOSIS — J301 Allergic rhinitis due to pollen: Secondary | ICD-10-CM | POA: Diagnosis not present

## 2016-10-02 DIAGNOSIS — J3089 Other allergic rhinitis: Secondary | ICD-10-CM | POA: Diagnosis not present

## 2016-10-05 DIAGNOSIS — J301 Allergic rhinitis due to pollen: Secondary | ICD-10-CM | POA: Diagnosis not present

## 2016-10-05 DIAGNOSIS — J3089 Other allergic rhinitis: Secondary | ICD-10-CM | POA: Diagnosis not present

## 2016-10-05 DIAGNOSIS — J3081 Allergic rhinitis due to animal (cat) (dog) hair and dander: Secondary | ICD-10-CM | POA: Diagnosis not present

## 2016-10-14 DIAGNOSIS — J3081 Allergic rhinitis due to animal (cat) (dog) hair and dander: Secondary | ICD-10-CM | POA: Diagnosis not present

## 2016-10-14 DIAGNOSIS — J3089 Other allergic rhinitis: Secondary | ICD-10-CM | POA: Diagnosis not present

## 2016-10-14 DIAGNOSIS — J301 Allergic rhinitis due to pollen: Secondary | ICD-10-CM | POA: Diagnosis not present

## 2016-10-21 DIAGNOSIS — J301 Allergic rhinitis due to pollen: Secondary | ICD-10-CM | POA: Diagnosis not present

## 2016-10-21 DIAGNOSIS — J3089 Other allergic rhinitis: Secondary | ICD-10-CM | POA: Diagnosis not present

## 2016-10-21 DIAGNOSIS — J3081 Allergic rhinitis due to animal (cat) (dog) hair and dander: Secondary | ICD-10-CM | POA: Diagnosis not present

## 2016-10-23 DIAGNOSIS — J301 Allergic rhinitis due to pollen: Secondary | ICD-10-CM | POA: Diagnosis not present

## 2016-10-27 DIAGNOSIS — J3089 Other allergic rhinitis: Secondary | ICD-10-CM | POA: Diagnosis not present

## 2016-10-27 DIAGNOSIS — J3081 Allergic rhinitis due to animal (cat) (dog) hair and dander: Secondary | ICD-10-CM | POA: Diagnosis not present

## 2016-11-04 DIAGNOSIS — J301 Allergic rhinitis due to pollen: Secondary | ICD-10-CM | POA: Diagnosis not present

## 2016-11-04 DIAGNOSIS — J3089 Other allergic rhinitis: Secondary | ICD-10-CM | POA: Diagnosis not present

## 2016-11-04 DIAGNOSIS — J3081 Allergic rhinitis due to animal (cat) (dog) hair and dander: Secondary | ICD-10-CM | POA: Diagnosis not present

## 2016-11-10 DIAGNOSIS — J3081 Allergic rhinitis due to animal (cat) (dog) hair and dander: Secondary | ICD-10-CM | POA: Diagnosis not present

## 2016-11-10 DIAGNOSIS — J3089 Other allergic rhinitis: Secondary | ICD-10-CM | POA: Diagnosis not present

## 2016-11-10 DIAGNOSIS — J301 Allergic rhinitis due to pollen: Secondary | ICD-10-CM | POA: Diagnosis not present

## 2016-11-16 DIAGNOSIS — J301 Allergic rhinitis due to pollen: Secondary | ICD-10-CM | POA: Diagnosis not present

## 2016-11-16 DIAGNOSIS — J3081 Allergic rhinitis due to animal (cat) (dog) hair and dander: Secondary | ICD-10-CM | POA: Diagnosis not present

## 2016-11-16 DIAGNOSIS — J3089 Other allergic rhinitis: Secondary | ICD-10-CM | POA: Diagnosis not present

## 2016-12-01 DIAGNOSIS — J3081 Allergic rhinitis due to animal (cat) (dog) hair and dander: Secondary | ICD-10-CM | POA: Diagnosis not present

## 2016-12-01 DIAGNOSIS — J3089 Other allergic rhinitis: Secondary | ICD-10-CM | POA: Diagnosis not present

## 2016-12-01 DIAGNOSIS — J301 Allergic rhinitis due to pollen: Secondary | ICD-10-CM | POA: Diagnosis not present

## 2016-12-07 DIAGNOSIS — J301 Allergic rhinitis due to pollen: Secondary | ICD-10-CM | POA: Diagnosis not present

## 2016-12-07 DIAGNOSIS — J3081 Allergic rhinitis due to animal (cat) (dog) hair and dander: Secondary | ICD-10-CM | POA: Diagnosis not present

## 2016-12-07 DIAGNOSIS — J3089 Other allergic rhinitis: Secondary | ICD-10-CM | POA: Diagnosis not present

## 2016-12-10 DIAGNOSIS — J301 Allergic rhinitis due to pollen: Secondary | ICD-10-CM | POA: Diagnosis not present

## 2016-12-10 DIAGNOSIS — J3089 Other allergic rhinitis: Secondary | ICD-10-CM | POA: Diagnosis not present

## 2016-12-10 DIAGNOSIS — J3081 Allergic rhinitis due to animal (cat) (dog) hair and dander: Secondary | ICD-10-CM | POA: Diagnosis not present

## 2016-12-16 DIAGNOSIS — J301 Allergic rhinitis due to pollen: Secondary | ICD-10-CM | POA: Diagnosis not present

## 2016-12-16 DIAGNOSIS — J3081 Allergic rhinitis due to animal (cat) (dog) hair and dander: Secondary | ICD-10-CM | POA: Diagnosis not present

## 2016-12-16 DIAGNOSIS — J3089 Other allergic rhinitis: Secondary | ICD-10-CM | POA: Diagnosis not present

## 2016-12-18 DIAGNOSIS — J3081 Allergic rhinitis due to animal (cat) (dog) hair and dander: Secondary | ICD-10-CM | POA: Diagnosis not present

## 2016-12-18 DIAGNOSIS — R05 Cough: Secondary | ICD-10-CM | POA: Diagnosis not present

## 2016-12-18 DIAGNOSIS — J3089 Other allergic rhinitis: Secondary | ICD-10-CM | POA: Diagnosis not present

## 2016-12-18 DIAGNOSIS — J301 Allergic rhinitis due to pollen: Secondary | ICD-10-CM | POA: Diagnosis not present

## 2016-12-22 DIAGNOSIS — J3081 Allergic rhinitis due to animal (cat) (dog) hair and dander: Secondary | ICD-10-CM | POA: Diagnosis not present

## 2016-12-22 DIAGNOSIS — J3089 Other allergic rhinitis: Secondary | ICD-10-CM | POA: Diagnosis not present

## 2016-12-22 DIAGNOSIS — J301 Allergic rhinitis due to pollen: Secondary | ICD-10-CM | POA: Diagnosis not present

## 2016-12-29 DIAGNOSIS — J301 Allergic rhinitis due to pollen: Secondary | ICD-10-CM | POA: Diagnosis not present

## 2016-12-29 DIAGNOSIS — J3089 Other allergic rhinitis: Secondary | ICD-10-CM | POA: Diagnosis not present

## 2016-12-29 DIAGNOSIS — J3081 Allergic rhinitis due to animal (cat) (dog) hair and dander: Secondary | ICD-10-CM | POA: Diagnosis not present

## 2017-01-06 DIAGNOSIS — J3089 Other allergic rhinitis: Secondary | ICD-10-CM | POA: Diagnosis not present

## 2017-01-06 DIAGNOSIS — J301 Allergic rhinitis due to pollen: Secondary | ICD-10-CM | POA: Diagnosis not present

## 2017-01-06 DIAGNOSIS — J3081 Allergic rhinitis due to animal (cat) (dog) hair and dander: Secondary | ICD-10-CM | POA: Diagnosis not present

## 2017-01-18 DIAGNOSIS — J3081 Allergic rhinitis due to animal (cat) (dog) hair and dander: Secondary | ICD-10-CM | POA: Diagnosis not present

## 2017-01-18 DIAGNOSIS — J3089 Other allergic rhinitis: Secondary | ICD-10-CM | POA: Diagnosis not present

## 2017-01-18 DIAGNOSIS — J301 Allergic rhinitis due to pollen: Secondary | ICD-10-CM | POA: Diagnosis not present

## 2017-01-27 DIAGNOSIS — J3089 Other allergic rhinitis: Secondary | ICD-10-CM | POA: Diagnosis not present

## 2017-01-27 DIAGNOSIS — J3081 Allergic rhinitis due to animal (cat) (dog) hair and dander: Secondary | ICD-10-CM | POA: Diagnosis not present

## 2017-01-27 DIAGNOSIS — J301 Allergic rhinitis due to pollen: Secondary | ICD-10-CM | POA: Diagnosis not present

## 2017-02-03 DIAGNOSIS — J3081 Allergic rhinitis due to animal (cat) (dog) hair and dander: Secondary | ICD-10-CM | POA: Diagnosis not present

## 2017-02-03 DIAGNOSIS — J3089 Other allergic rhinitis: Secondary | ICD-10-CM | POA: Diagnosis not present

## 2017-02-03 DIAGNOSIS — J301 Allergic rhinitis due to pollen: Secondary | ICD-10-CM | POA: Diagnosis not present

## 2017-02-09 DIAGNOSIS — J3089 Other allergic rhinitis: Secondary | ICD-10-CM | POA: Diagnosis not present

## 2017-02-09 DIAGNOSIS — J3081 Allergic rhinitis due to animal (cat) (dog) hair and dander: Secondary | ICD-10-CM | POA: Diagnosis not present

## 2017-02-09 DIAGNOSIS — J301 Allergic rhinitis due to pollen: Secondary | ICD-10-CM | POA: Diagnosis not present

## 2017-02-16 DIAGNOSIS — J3089 Other allergic rhinitis: Secondary | ICD-10-CM | POA: Diagnosis not present

## 2017-02-16 DIAGNOSIS — J301 Allergic rhinitis due to pollen: Secondary | ICD-10-CM | POA: Diagnosis not present

## 2017-02-16 DIAGNOSIS — J3081 Allergic rhinitis due to animal (cat) (dog) hair and dander: Secondary | ICD-10-CM | POA: Diagnosis not present

## 2017-02-25 DIAGNOSIS — J3089 Other allergic rhinitis: Secondary | ICD-10-CM | POA: Diagnosis not present

## 2017-02-25 DIAGNOSIS — J3081 Allergic rhinitis due to animal (cat) (dog) hair and dander: Secondary | ICD-10-CM | POA: Diagnosis not present

## 2017-02-25 DIAGNOSIS — J301 Allergic rhinitis due to pollen: Secondary | ICD-10-CM | POA: Diagnosis not present

## 2017-03-03 ENCOUNTER — Ambulatory Visit (INDEPENDENT_AMBULATORY_CARE_PROVIDER_SITE_OTHER): Payer: BLUE CROSS/BLUE SHIELD | Admitting: Orthopedic Surgery

## 2017-03-03 ENCOUNTER — Encounter (INDEPENDENT_AMBULATORY_CARE_PROVIDER_SITE_OTHER): Payer: Self-pay | Admitting: Orthopedic Surgery

## 2017-03-03 ENCOUNTER — Ambulatory Visit (INDEPENDENT_AMBULATORY_CARE_PROVIDER_SITE_OTHER): Payer: Self-pay

## 2017-03-03 DIAGNOSIS — J3081 Allergic rhinitis due to animal (cat) (dog) hair and dander: Secondary | ICD-10-CM | POA: Diagnosis not present

## 2017-03-03 DIAGNOSIS — J301 Allergic rhinitis due to pollen: Secondary | ICD-10-CM | POA: Diagnosis not present

## 2017-03-03 DIAGNOSIS — M79672 Pain in left foot: Secondary | ICD-10-CM

## 2017-03-03 DIAGNOSIS — J3089 Other allergic rhinitis: Secondary | ICD-10-CM | POA: Diagnosis not present

## 2017-03-03 NOTE — Progress Notes (Signed)
Office Visit Note   Patient: Jose Calderon           Date of Birth: 1955-11-06           MRN: [id number] Visit Date: 03/03/2017 Requested by: Jani Gravel, MD [address] [place], [place] [address] PCP: Jani Gravel, MD  Subjective: Chief Complaint  Patient presents with  . Left Foot - Pain, Injury    HPI: Jose Calderon is a 61 year old patient with left ankle pain.  He describes having an injury last fall where his foot was twisted in a pronation external rotation type manner.  He's been having some medial sided pain since that time.  It is gradually improved.  He has recently sold his to [employer] and made be becoming more active.  He denies any other knee or hip pain associated with this injury.  He's been taking some over-the-counter medication with mild relief.              ROS: All systems reviewed are negative as they relate to the chief complaint within the history of present illness.  Patient denies  fevers or chills.   Assessment & Plan: Visit Diagnoses:  1. Left foot pain     Plan: Impression is pronation external rotation injury to the left ankle with evidence of some ossification at the proximal aspect of the syndesmosis.  His syndesmosis is stable on examination today.  The rest of his ankle tendons are functional intact and nontender.  I think this likely represents the sequelae of a high ankle sprain with syndesmotic injury.  No proximal fibular fracture is noted.  There is no widening of the medial clear space.  I think that should be a self-limited injury and we could pursue further imaging to delineate the extent.  Also that would make sure that there is no chondral injury on the talus.  He is going away for now and see how it progresses.  Further imaging would be indicated if he fails to improve.  I'll see him back as needed.  Follow-Up Instructions: No Follow-up on file.   Orders:  Orders Placed This Encounter  Procedures  . XR Foot Complete Left  .  XR Ankle Complete Left   No orders of the defined types were placed in this encounter.     Procedures: No procedures performed   Clinical Data: No additional findings.  Objective: Vital Signs: There were no vitals taken for this visit.  Physical Exam:   Constitutional: Patient appears well-developed HEENT:  Head: Normocephalic Eyes:EOM are normal Neck: Normal range of motion Cardiovascular: Normal rate Pulmonary/chest: Effort normal Neurologic: Patient is alert Skin: Skin is warm Psychiatric: Patient has normal mood and affect    Ortho Exam: Orthopedic exam demonstrates normal gait and alignment.  He can stand on his toes without difficulty.  Pedal pulses palpable.  He has palpable nontender intact anterior to posterior tib peroneal and Achilles tendon.  Has a little bit restricted plantar flexion by 10 on the left compared to the right.  Ankle dorsiflexion is generally symmetric.  Syndesmosis feel stable.  No proximal fibular tenderness is noted.  Specialty Comments:  No specialty comments available.  Imaging: Xr Ankle Complete Left  Result Date: 03/03/2017 AP and mortise and lateral view left ankle reviewed.  Some calcification within the proximal aspect of the syndesmosis is present.  Mild spurring off the medial malleolus is noted.  No evidence of posterior malleolar fracture.  Mortise is symmetric.  No  evidence of syndesmotic injury on static views.  No other fractures of the lateral process talus anterior process calcaneus visualized  Xr Foot Complete Left  Result Date: 03/03/2017 AP lateral oblique left foot reviewed.  No fracture seen.  Tarsometatarsal articulation is normal.  Minimal arthritis seen.    PMFS History: Patient Active Problem List   Diagnosis Date Noted  . Prostate cancer ([facility]) 06/22/2011   Past Medical History:  Diagnosis Date  . Allergy    amlodipine =rash  . Hyperlipidemia   . Hypertension   . Prostate cancer ([facility]) 06/22/11    Adenocarcinoma    Family History  Problem Relation Age of Onset  . Cancer Brother     prostate /radical prostatectomy  . Cancer Mother     Past Surgical History:  Procedure Laterality Date  . PROSTATE BIOPSY  06/22/11   Adenocarcinoma  . PROSTATE BIOPSY  02/07/13   2nd biopsy=Adenocarcinoma   Social History   Occupational History  .      Camera operator   Social History Main Topics  . Smoking status: Never Smoker  . Smokeless tobacco: Never Used  . Alcohol use Yes     Comment: beer  4-5 12 ounce cans daily  . Drug use: No  . Sexual activity: Yes

## 2017-03-09 DIAGNOSIS — J3089 Other allergic rhinitis: Secondary | ICD-10-CM | POA: Diagnosis not present

## 2017-03-09 DIAGNOSIS — J301 Allergic rhinitis due to pollen: Secondary | ICD-10-CM | POA: Diagnosis not present

## 2017-03-09 DIAGNOSIS — J3081 Allergic rhinitis due to animal (cat) (dog) hair and dander: Secondary | ICD-10-CM | POA: Diagnosis not present

## 2017-03-17 DIAGNOSIS — J301 Allergic rhinitis due to pollen: Secondary | ICD-10-CM | POA: Diagnosis not present

## 2017-03-17 DIAGNOSIS — J3089 Other allergic rhinitis: Secondary | ICD-10-CM | POA: Diagnosis not present

## 2017-03-17 DIAGNOSIS — J3081 Allergic rhinitis due to animal (cat) (dog) hair and dander: Secondary | ICD-10-CM | POA: Diagnosis not present

## 2017-03-23 DIAGNOSIS — J301 Allergic rhinitis due to pollen: Secondary | ICD-10-CM | POA: Diagnosis not present

## 2017-03-23 DIAGNOSIS — J3089 Other allergic rhinitis: Secondary | ICD-10-CM | POA: Diagnosis not present

## 2017-03-23 DIAGNOSIS — J3081 Allergic rhinitis due to animal (cat) (dog) hair and dander: Secondary | ICD-10-CM | POA: Diagnosis not present

## 2017-03-26 DIAGNOSIS — J3081 Allergic rhinitis due to animal (cat) (dog) hair and dander: Secondary | ICD-10-CM | POA: Diagnosis not present

## 2017-03-26 DIAGNOSIS — J3089 Other allergic rhinitis: Secondary | ICD-10-CM | POA: Diagnosis not present

## 2017-03-30 DIAGNOSIS — J3089 Other allergic rhinitis: Secondary | ICD-10-CM | POA: Diagnosis not present

## 2017-03-30 DIAGNOSIS — J3081 Allergic rhinitis due to animal (cat) (dog) hair and dander: Secondary | ICD-10-CM | POA: Diagnosis not present

## 2017-03-31 DIAGNOSIS — J3089 Other allergic rhinitis: Secondary | ICD-10-CM | POA: Diagnosis not present

## 2017-03-31 DIAGNOSIS — J3081 Allergic rhinitis due to animal (cat) (dog) hair and dander: Secondary | ICD-10-CM | POA: Diagnosis not present

## 2017-03-31 DIAGNOSIS — J301 Allergic rhinitis due to pollen: Secondary | ICD-10-CM | POA: Diagnosis not present

## 2017-04-06 DIAGNOSIS — J3081 Allergic rhinitis due to animal (cat) (dog) hair and dander: Secondary | ICD-10-CM | POA: Diagnosis not present

## 2017-04-06 DIAGNOSIS — J301 Allergic rhinitis due to pollen: Secondary | ICD-10-CM | POA: Diagnosis not present

## 2017-04-06 DIAGNOSIS — J3089 Other allergic rhinitis: Secondary | ICD-10-CM | POA: Diagnosis not present

## 2017-04-13 DIAGNOSIS — J3081 Allergic rhinitis due to animal (cat) (dog) hair and dander: Secondary | ICD-10-CM | POA: Diagnosis not present

## 2017-04-13 DIAGNOSIS — J3089 Other allergic rhinitis: Secondary | ICD-10-CM | POA: Diagnosis not present

## 2017-04-13 DIAGNOSIS — J301 Allergic rhinitis due to pollen: Secondary | ICD-10-CM | POA: Diagnosis not present

## 2017-04-26 DIAGNOSIS — J3081 Allergic rhinitis due to animal (cat) (dog) hair and dander: Secondary | ICD-10-CM | POA: Diagnosis not present

## 2017-04-26 DIAGNOSIS — J3089 Other allergic rhinitis: Secondary | ICD-10-CM | POA: Diagnosis not present

## 2017-04-26 DIAGNOSIS — J301 Allergic rhinitis due to pollen: Secondary | ICD-10-CM | POA: Diagnosis not present

## 2017-05-03 DIAGNOSIS — J301 Allergic rhinitis due to pollen: Secondary | ICD-10-CM | POA: Diagnosis not present

## 2017-05-03 DIAGNOSIS — J3089 Other allergic rhinitis: Secondary | ICD-10-CM | POA: Diagnosis not present

## 2017-05-03 DIAGNOSIS — J3081 Allergic rhinitis due to animal (cat) (dog) hair and dander: Secondary | ICD-10-CM | POA: Diagnosis not present

## 2017-05-10 DIAGNOSIS — J3089 Other allergic rhinitis: Secondary | ICD-10-CM | POA: Diagnosis not present

## 2017-05-10 DIAGNOSIS — J301 Allergic rhinitis due to pollen: Secondary | ICD-10-CM | POA: Diagnosis not present

## 2017-05-10 DIAGNOSIS — J3081 Allergic rhinitis due to animal (cat) (dog) hair and dander: Secondary | ICD-10-CM | POA: Diagnosis not present

## 2017-05-17 DIAGNOSIS — I10 Essential (primary) hypertension: Secondary | ICD-10-CM | POA: Diagnosis not present

## 2017-05-17 DIAGNOSIS — C61 Malignant neoplasm of prostate: Secondary | ICD-10-CM | POA: Diagnosis not present

## 2017-05-17 DIAGNOSIS — J3081 Allergic rhinitis due to animal (cat) (dog) hair and dander: Secondary | ICD-10-CM | POA: Diagnosis not present

## 2017-05-17 DIAGNOSIS — J3089 Other allergic rhinitis: Secondary | ICD-10-CM | POA: Diagnosis not present

## 2017-05-17 DIAGNOSIS — J301 Allergic rhinitis due to pollen: Secondary | ICD-10-CM | POA: Diagnosis not present

## 2017-05-24 DIAGNOSIS — J301 Allergic rhinitis due to pollen: Secondary | ICD-10-CM | POA: Diagnosis not present

## 2017-05-24 DIAGNOSIS — C61 Malignant neoplasm of prostate: Secondary | ICD-10-CM | POA: Diagnosis not present

## 2017-05-24 DIAGNOSIS — J3089 Other allergic rhinitis: Secondary | ICD-10-CM | POA: Diagnosis not present

## 2017-05-24 DIAGNOSIS — I10 Essential (primary) hypertension: Secondary | ICD-10-CM | POA: Diagnosis not present

## 2017-05-24 DIAGNOSIS — J3081 Allergic rhinitis due to animal (cat) (dog) hair and dander: Secondary | ICD-10-CM | POA: Diagnosis not present

## 2017-05-24 DIAGNOSIS — Z0000 Encounter for general adult medical examination without abnormal findings: Secondary | ICD-10-CM | POA: Diagnosis not present

## 2017-05-27 DIAGNOSIS — J3089 Other allergic rhinitis: Secondary | ICD-10-CM | POA: Diagnosis not present

## 2017-05-27 DIAGNOSIS — J301 Allergic rhinitis due to pollen: Secondary | ICD-10-CM | POA: Diagnosis not present

## 2017-05-27 DIAGNOSIS — J3081 Allergic rhinitis due to animal (cat) (dog) hair and dander: Secondary | ICD-10-CM | POA: Diagnosis not present

## 2017-06-01 DIAGNOSIS — J301 Allergic rhinitis due to pollen: Secondary | ICD-10-CM | POA: Diagnosis not present

## 2017-06-01 DIAGNOSIS — J3081 Allergic rhinitis due to animal (cat) (dog) hair and dander: Secondary | ICD-10-CM | POA: Diagnosis not present

## 2017-06-01 DIAGNOSIS — J3089 Other allergic rhinitis: Secondary | ICD-10-CM | POA: Diagnosis not present

## 2017-06-08 DIAGNOSIS — J3089 Other allergic rhinitis: Secondary | ICD-10-CM | POA: Diagnosis not present

## 2017-06-08 DIAGNOSIS — J3081 Allergic rhinitis due to animal (cat) (dog) hair and dander: Secondary | ICD-10-CM | POA: Diagnosis not present

## 2017-06-08 DIAGNOSIS — J301 Allergic rhinitis due to pollen: Secondary | ICD-10-CM | POA: Diagnosis not present

## 2017-06-15 DIAGNOSIS — J3081 Allergic rhinitis due to animal (cat) (dog) hair and dander: Secondary | ICD-10-CM | POA: Diagnosis not present

## 2017-06-15 DIAGNOSIS — J3089 Other allergic rhinitis: Secondary | ICD-10-CM | POA: Diagnosis not present

## 2017-06-15 DIAGNOSIS — J301 Allergic rhinitis due to pollen: Secondary | ICD-10-CM | POA: Diagnosis not present

## 2017-06-22 DIAGNOSIS — J3081 Allergic rhinitis due to animal (cat) (dog) hair and dander: Secondary | ICD-10-CM | POA: Diagnosis not present

## 2017-06-22 DIAGNOSIS — J301 Allergic rhinitis due to pollen: Secondary | ICD-10-CM | POA: Diagnosis not present

## 2017-06-22 DIAGNOSIS — J3089 Other allergic rhinitis: Secondary | ICD-10-CM | POA: Diagnosis not present

## 2017-06-29 DIAGNOSIS — J3089 Other allergic rhinitis: Secondary | ICD-10-CM | POA: Diagnosis not present

## 2017-06-29 DIAGNOSIS — J3081 Allergic rhinitis due to animal (cat) (dog) hair and dander: Secondary | ICD-10-CM | POA: Diagnosis not present

## 2017-06-29 DIAGNOSIS — J301 Allergic rhinitis due to pollen: Secondary | ICD-10-CM | POA: Diagnosis not present

## 2017-07-06 DIAGNOSIS — J301 Allergic rhinitis due to pollen: Secondary | ICD-10-CM | POA: Diagnosis not present

## 2017-07-06 DIAGNOSIS — J3089 Other allergic rhinitis: Secondary | ICD-10-CM | POA: Diagnosis not present

## 2017-07-06 DIAGNOSIS — J3081 Allergic rhinitis due to animal (cat) (dog) hair and dander: Secondary | ICD-10-CM | POA: Diagnosis not present

## 2017-07-14 DIAGNOSIS — J301 Allergic rhinitis due to pollen: Secondary | ICD-10-CM | POA: Diagnosis not present

## 2017-07-14 DIAGNOSIS — J3089 Other allergic rhinitis: Secondary | ICD-10-CM | POA: Diagnosis not present

## 2017-07-14 DIAGNOSIS — J3081 Allergic rhinitis due to animal (cat) (dog) hair and dander: Secondary | ICD-10-CM | POA: Diagnosis not present

## 2017-07-23 DIAGNOSIS — J3089 Other allergic rhinitis: Secondary | ICD-10-CM | POA: Diagnosis not present

## 2017-07-23 DIAGNOSIS — J3081 Allergic rhinitis due to animal (cat) (dog) hair and dander: Secondary | ICD-10-CM | POA: Diagnosis not present

## 2017-07-23 DIAGNOSIS — J301 Allergic rhinitis due to pollen: Secondary | ICD-10-CM | POA: Diagnosis not present

## 2017-07-30 DIAGNOSIS — J3081 Allergic rhinitis due to animal (cat) (dog) hair and dander: Secondary | ICD-10-CM | POA: Diagnosis not present

## 2017-07-30 DIAGNOSIS — J301 Allergic rhinitis due to pollen: Secondary | ICD-10-CM | POA: Diagnosis not present

## 2017-07-30 DIAGNOSIS — J3089 Other allergic rhinitis: Secondary | ICD-10-CM | POA: Diagnosis not present

## 2017-08-16 DIAGNOSIS — J301 Allergic rhinitis due to pollen: Secondary | ICD-10-CM | POA: Diagnosis not present

## 2017-08-16 DIAGNOSIS — J3089 Other allergic rhinitis: Secondary | ICD-10-CM | POA: Diagnosis not present

## 2017-08-16 DIAGNOSIS — J3081 Allergic rhinitis due to animal (cat) (dog) hair and dander: Secondary | ICD-10-CM | POA: Diagnosis not present

## 2017-08-23 DIAGNOSIS — J3089 Other allergic rhinitis: Secondary | ICD-10-CM | POA: Diagnosis not present

## 2017-08-23 DIAGNOSIS — J301 Allergic rhinitis due to pollen: Secondary | ICD-10-CM | POA: Diagnosis not present

## 2017-08-23 DIAGNOSIS — J3081 Allergic rhinitis due to animal (cat) (dog) hair and dander: Secondary | ICD-10-CM | POA: Diagnosis not present

## 2017-08-30 DIAGNOSIS — J301 Allergic rhinitis due to pollen: Secondary | ICD-10-CM | POA: Diagnosis not present

## 2017-08-30 DIAGNOSIS — J3089 Other allergic rhinitis: Secondary | ICD-10-CM | POA: Diagnosis not present

## 2017-08-30 DIAGNOSIS — J3081 Allergic rhinitis due to animal (cat) (dog) hair and dander: Secondary | ICD-10-CM | POA: Diagnosis not present

## 2017-09-06 DIAGNOSIS — L821 Other seborrheic keratosis: Secondary | ICD-10-CM | POA: Diagnosis not present

## 2017-09-06 DIAGNOSIS — L570 Actinic keratosis: Secondary | ICD-10-CM | POA: Diagnosis not present

## 2017-09-06 DIAGNOSIS — J3081 Allergic rhinitis due to animal (cat) (dog) hair and dander: Secondary | ICD-10-CM | POA: Diagnosis not present

## 2017-09-06 DIAGNOSIS — J3089 Other allergic rhinitis: Secondary | ICD-10-CM | POA: Diagnosis not present

## 2017-09-06 DIAGNOSIS — D2262 Melanocytic nevi of left upper limb, including shoulder: Secondary | ICD-10-CM | POA: Diagnosis not present

## 2017-09-06 DIAGNOSIS — J301 Allergic rhinitis due to pollen: Secondary | ICD-10-CM | POA: Diagnosis not present

## 2017-09-06 DIAGNOSIS — D225 Melanocytic nevi of trunk: Secondary | ICD-10-CM | POA: Diagnosis not present

## 2017-09-06 DIAGNOSIS — L814 Other melanin hyperpigmentation: Secondary | ICD-10-CM | POA: Diagnosis not present

## 2017-09-20 DIAGNOSIS — J301 Allergic rhinitis due to pollen: Secondary | ICD-10-CM | POA: Diagnosis not present

## 2017-09-20 DIAGNOSIS — J3089 Other allergic rhinitis: Secondary | ICD-10-CM | POA: Diagnosis not present

## 2017-09-20 DIAGNOSIS — J3081 Allergic rhinitis due to animal (cat) (dog) hair and dander: Secondary | ICD-10-CM | POA: Diagnosis not present

## 2017-09-27 DIAGNOSIS — J301 Allergic rhinitis due to pollen: Secondary | ICD-10-CM | POA: Diagnosis not present

## 2017-09-27 DIAGNOSIS — J3089 Other allergic rhinitis: Secondary | ICD-10-CM | POA: Diagnosis not present

## 2017-09-27 DIAGNOSIS — J3081 Allergic rhinitis due to animal (cat) (dog) hair and dander: Secondary | ICD-10-CM | POA: Diagnosis not present

## 2017-10-04 DIAGNOSIS — J301 Allergic rhinitis due to pollen: Secondary | ICD-10-CM | POA: Diagnosis not present

## 2017-10-04 DIAGNOSIS — J3081 Allergic rhinitis due to animal (cat) (dog) hair and dander: Secondary | ICD-10-CM | POA: Diagnosis not present

## 2017-10-04 DIAGNOSIS — J3089 Other allergic rhinitis: Secondary | ICD-10-CM | POA: Diagnosis not present

## 2017-10-12 DIAGNOSIS — J3089 Other allergic rhinitis: Secondary | ICD-10-CM | POA: Diagnosis not present

## 2017-10-12 DIAGNOSIS — J3081 Allergic rhinitis due to animal (cat) (dog) hair and dander: Secondary | ICD-10-CM | POA: Diagnosis not present

## 2017-10-12 DIAGNOSIS — J301 Allergic rhinitis due to pollen: Secondary | ICD-10-CM | POA: Diagnosis not present

## 2017-10-13 DIAGNOSIS — J3081 Allergic rhinitis due to animal (cat) (dog) hair and dander: Secondary | ICD-10-CM | POA: Diagnosis not present

## 2017-10-13 DIAGNOSIS — J3089 Other allergic rhinitis: Secondary | ICD-10-CM | POA: Diagnosis not present

## 2017-10-13 DIAGNOSIS — J301 Allergic rhinitis due to pollen: Secondary | ICD-10-CM | POA: Diagnosis not present

## 2017-10-14 ENCOUNTER — Other Ambulatory Visit: Payer: Self-pay

## 2017-10-14 ENCOUNTER — Ambulatory Visit: Payer: BLUE CROSS/BLUE SHIELD | Admitting: Physician Assistant

## 2017-10-14 ENCOUNTER — Encounter: Payer: Self-pay | Admitting: Physician Assistant

## 2017-10-14 VITALS — BP 144/92 | HR 82 | Temp 98.6°F | Resp 18 | Ht 68.0 in | Wt 212.2 lb

## 2017-10-14 DIAGNOSIS — R062 Wheezing: Secondary | ICD-10-CM

## 2017-10-14 DIAGNOSIS — R05 Cough: Secondary | ICD-10-CM | POA: Diagnosis not present

## 2017-10-14 DIAGNOSIS — R059 Cough, unspecified: Secondary | ICD-10-CM

## 2017-10-14 MED ORDER — ALBUTEROL SULFATE HFA 108 (90 BASE) MCG/ACT IN AERS: 2.0000 | INHALATION_SPRAY | RESPIRATORY_TRACT | 0 refills | Status: AC | PRN

## 2017-10-14 MED ORDER — AZITHROMYCIN 250 MG PO TABS: ORAL_TABLET | ORAL | 0 refills | Status: AC

## 2017-10-14 NOTE — Patient Instructions (Signed)
     IF you received an x-ray today, you will receive an invoice from [facility]. Please contact [facility] at [phone] with questions or concerns regarding your invoice.   IF you received labwork today, you will receive an invoice from [facility]. Please contact [facility] at [phone] with questions or concerns regarding your invoice.   Our billing staff will not be able to assist you with questions regarding bills from these companies.  You will be contacted with the lab results as soon as they are available. The fastest way to get your results is to activate your My Chart account. Instructions are located on the last page of this paperwork. If you have not heard from us regarding the results in 2 weeks, please contact this office.     

## 2017-10-14 NOTE — Progress Notes (Signed)
    10/14/2017 1:43 PM   DOB: [birth year] / MRN: [id number]  SUBJECTIVE:  Jose Calderon is a 61 y.o. male presenting for sore throat, nasal congestion and cough.  Tells me that he has this every time this year and always turns into bronchitis and once it turned a radiographic pneumonia. 99.5 fever this morning.  Took mucinex and two ASA and this seemed to help.   He has No Known Allergies.   He  has a past medical history of Allergy, Hyperlipidemia, Hypertension, and Prostate cancer ([facility]) (06/22/11).    He  reports that  has never smoked. he has never used smokeless tobacco. He reports that he drinks alcohol. He reports that he does not use drugs. He  reports that he currently engages in sexual activity. The patient  has a past surgical history that includes Prostate biopsy (06/22/11) and Prostate biopsy (02/07/13).  His family history includes Cancer in his brother and mother.  Review of Systems  Constitutional: Negative for chills, diaphoresis and fever.  Eyes: Negative.   Respiratory: Negative for shortness of breath.   Cardiovascular: Negative for chest pain, orthopnea and leg swelling.  Gastrointestinal: Negative for nausea.  Skin: Negative for rash.  Neurological: Negative for dizziness, sensory change, speech change, focal weakness and headaches.    The problem list and medications were reviewed and updated by myself where necessary and exist elsewhere in the encounter.   OBJECTIVE:  BP (!) 144/92   Pulse 82   Temp 98.6 F (37 C) (Oral)   Resp 18   Ht 5\' 8"  (1.727 m)   Wt 212 lb 3.2 oz (96.3 kg)   SpO2 97%   BMI 32.26 kg/m   Physical Exam  Constitutional: He is oriented to person, place, and time. He appears well-developed and well-nourished. No distress.  Cardiovascular: Normal rate and regular rhythm.  Pulmonary/Chest: Effort normal. No respiratory distress. He has wheezes. He has no rales. He exhibits no tenderness.  Musculoskeletal: Normal range of motion. He exhibits  no edema, tenderness or deformity.  Neurological: He is alert and oriented to person, place, and time.  Skin: Skin is warm. No rash noted. He is diaphoretic. No erythema. No pallor.      No results found for this or any previous visit (from the past 72 hour(s)).  No results found.  ASSESSMENT AND PLAN:  Jose Calderon was seen today for sore throat, cough and nasal congestion.  Diagnoses and all orders for this visit:  Cough: Per patient history of radiographic pneumonia with similar presentation.  Well appearing with normal vitals here however with ASA on board.  He is wheezing.  Sats okay. Will cover for CAP and start an inhaler. -     azithromycin (ZITHROMAX) 250 MG tablet; Take two on day one and one daily thereafter.  Wheeze -     albuterol (PROVENTIL HFA;VENTOLIN HFA) 108 (90 Base) MCG/ACT inhaler; Inhale 2 puffs into the lungs every 4 (four) hours as needed for wheezing or shortness of breath.    The patient is advised to call or return to clinic if he does not see an improvement in symptoms, or to seek the care of the closest emergency department if he worsens with the above plan.   Philis Fendt, MHS, PA-C [facility] 10/14/2017 1:43 PM

## 2017-10-18 DIAGNOSIS — J301 Allergic rhinitis due to pollen: Secondary | ICD-10-CM | POA: Diagnosis not present

## 2017-10-18 DIAGNOSIS — J3089 Other allergic rhinitis: Secondary | ICD-10-CM | POA: Diagnosis not present

## 2017-10-18 DIAGNOSIS — J3081 Allergic rhinitis due to animal (cat) (dog) hair and dander: Secondary | ICD-10-CM | POA: Diagnosis not present

## 2017-11-01 DIAGNOSIS — J301 Allergic rhinitis due to pollen: Secondary | ICD-10-CM | POA: Diagnosis not present

## 2017-11-01 DIAGNOSIS — J3089 Other allergic rhinitis: Secondary | ICD-10-CM | POA: Diagnosis not present

## 2017-11-01 DIAGNOSIS — J3081 Allergic rhinitis due to animal (cat) (dog) hair and dander: Secondary | ICD-10-CM | POA: Diagnosis not present

## 2017-11-15 DIAGNOSIS — J301 Allergic rhinitis due to pollen: Secondary | ICD-10-CM | POA: Diagnosis not present

## 2017-11-15 DIAGNOSIS — J3081 Allergic rhinitis due to animal (cat) (dog) hair and dander: Secondary | ICD-10-CM | POA: Diagnosis not present

## 2017-11-15 DIAGNOSIS — J3089 Other allergic rhinitis: Secondary | ICD-10-CM | POA: Diagnosis not present

## 2017-11-19 DIAGNOSIS — J301 Allergic rhinitis due to pollen: Secondary | ICD-10-CM | POA: Diagnosis not present

## 2017-11-19 DIAGNOSIS — J3081 Allergic rhinitis due to animal (cat) (dog) hair and dander: Secondary | ICD-10-CM | POA: Diagnosis not present

## 2017-11-19 DIAGNOSIS — J3089 Other allergic rhinitis: Secondary | ICD-10-CM | POA: Diagnosis not present

## 2017-11-30 DIAGNOSIS — J3089 Other allergic rhinitis: Secondary | ICD-10-CM | POA: Diagnosis not present

## 2017-11-30 DIAGNOSIS — J3081 Allergic rhinitis due to animal (cat) (dog) hair and dander: Secondary | ICD-10-CM | POA: Diagnosis not present

## 2017-11-30 DIAGNOSIS — J301 Allergic rhinitis due to pollen: Secondary | ICD-10-CM | POA: Diagnosis not present

## 2017-12-07 DIAGNOSIS — J301 Allergic rhinitis due to pollen: Secondary | ICD-10-CM | POA: Diagnosis not present

## 2017-12-07 DIAGNOSIS — J3089 Other allergic rhinitis: Secondary | ICD-10-CM | POA: Diagnosis not present

## 2017-12-07 DIAGNOSIS — J3081 Allergic rhinitis due to animal (cat) (dog) hair and dander: Secondary | ICD-10-CM | POA: Diagnosis not present

## 2017-12-08 DIAGNOSIS — K219 Gastro-esophageal reflux disease without esophagitis: Secondary | ICD-10-CM | POA: Diagnosis not present

## 2017-12-08 DIAGNOSIS — J3081 Allergic rhinitis due to animal (cat) (dog) hair and dander: Secondary | ICD-10-CM | POA: Diagnosis not present

## 2017-12-08 DIAGNOSIS — R05 Cough: Secondary | ICD-10-CM | POA: Diagnosis not present

## 2017-12-08 DIAGNOSIS — J3089 Other allergic rhinitis: Secondary | ICD-10-CM | POA: Diagnosis not present

## 2017-12-08 DIAGNOSIS — J301 Allergic rhinitis due to pollen: Secondary | ICD-10-CM | POA: Diagnosis not present

## 2017-12-13 DIAGNOSIS — J301 Allergic rhinitis due to pollen: Secondary | ICD-10-CM | POA: Diagnosis not present

## 2017-12-13 DIAGNOSIS — J3081 Allergic rhinitis due to animal (cat) (dog) hair and dander: Secondary | ICD-10-CM | POA: Diagnosis not present

## 2017-12-13 DIAGNOSIS — J3089 Other allergic rhinitis: Secondary | ICD-10-CM | POA: Diagnosis not present

## 2017-12-24 DIAGNOSIS — J301 Allergic rhinitis due to pollen: Secondary | ICD-10-CM | POA: Diagnosis not present

## 2017-12-24 DIAGNOSIS — J3089 Other allergic rhinitis: Secondary | ICD-10-CM | POA: Diagnosis not present

## 2017-12-24 DIAGNOSIS — J3081 Allergic rhinitis due to animal (cat) (dog) hair and dander: Secondary | ICD-10-CM | POA: Diagnosis not present

## 2017-12-31 DIAGNOSIS — J3081 Allergic rhinitis due to animal (cat) (dog) hair and dander: Secondary | ICD-10-CM | POA: Diagnosis not present

## 2017-12-31 DIAGNOSIS — J301 Allergic rhinitis due to pollen: Secondary | ICD-10-CM | POA: Diagnosis not present

## 2017-12-31 DIAGNOSIS — J3089 Other allergic rhinitis: Secondary | ICD-10-CM | POA: Diagnosis not present

## 2018-01-10 DIAGNOSIS — J3089 Other allergic rhinitis: Secondary | ICD-10-CM | POA: Diagnosis not present

## 2018-01-10 DIAGNOSIS — J301 Allergic rhinitis due to pollen: Secondary | ICD-10-CM | POA: Diagnosis not present

## 2018-01-10 DIAGNOSIS — J3081 Allergic rhinitis due to animal (cat) (dog) hair and dander: Secondary | ICD-10-CM | POA: Diagnosis not present

## 2018-01-14 DIAGNOSIS — J3081 Allergic rhinitis due to animal (cat) (dog) hair and dander: Secondary | ICD-10-CM | POA: Diagnosis not present

## 2018-01-14 DIAGNOSIS — J3089 Other allergic rhinitis: Secondary | ICD-10-CM | POA: Diagnosis not present

## 2018-01-24 DIAGNOSIS — J301 Allergic rhinitis due to pollen: Secondary | ICD-10-CM | POA: Diagnosis not present

## 2018-01-24 DIAGNOSIS — J3089 Other allergic rhinitis: Secondary | ICD-10-CM | POA: Diagnosis not present

## 2018-01-24 DIAGNOSIS — J3081 Allergic rhinitis due to animal (cat) (dog) hair and dander: Secondary | ICD-10-CM | POA: Diagnosis not present

## 2018-02-02 DIAGNOSIS — J3089 Other allergic rhinitis: Secondary | ICD-10-CM | POA: Diagnosis not present

## 2018-02-02 DIAGNOSIS — J301 Allergic rhinitis due to pollen: Secondary | ICD-10-CM | POA: Diagnosis not present

## 2018-02-02 DIAGNOSIS — J3081 Allergic rhinitis due to animal (cat) (dog) hair and dander: Secondary | ICD-10-CM | POA: Diagnosis not present

## 2018-02-08 DIAGNOSIS — J301 Allergic rhinitis due to pollen: Secondary | ICD-10-CM | POA: Diagnosis not present

## 2018-02-08 DIAGNOSIS — J3081 Allergic rhinitis due to animal (cat) (dog) hair and dander: Secondary | ICD-10-CM | POA: Diagnosis not present

## 2018-02-08 DIAGNOSIS — J3089 Other allergic rhinitis: Secondary | ICD-10-CM | POA: Diagnosis not present

## 2018-02-16 DIAGNOSIS — J3081 Allergic rhinitis due to animal (cat) (dog) hair and dander: Secondary | ICD-10-CM | POA: Diagnosis not present

## 2018-02-16 DIAGNOSIS — J3089 Other allergic rhinitis: Secondary | ICD-10-CM | POA: Diagnosis not present

## 2018-02-16 DIAGNOSIS — J301 Allergic rhinitis due to pollen: Secondary | ICD-10-CM | POA: Diagnosis not present

## 2018-02-23 DIAGNOSIS — J3089 Other allergic rhinitis: Secondary | ICD-10-CM | POA: Diagnosis not present

## 2018-02-23 DIAGNOSIS — J3081 Allergic rhinitis due to animal (cat) (dog) hair and dander: Secondary | ICD-10-CM | POA: Diagnosis not present

## 2018-02-23 DIAGNOSIS — J301 Allergic rhinitis due to pollen: Secondary | ICD-10-CM | POA: Diagnosis not present

## 2018-03-02 DIAGNOSIS — J3089 Other allergic rhinitis: Secondary | ICD-10-CM | POA: Diagnosis not present

## 2018-03-02 DIAGNOSIS — J3081 Allergic rhinitis due to animal (cat) (dog) hair and dander: Secondary | ICD-10-CM | POA: Diagnosis not present

## 2018-03-02 DIAGNOSIS — J301 Allergic rhinitis due to pollen: Secondary | ICD-10-CM | POA: Diagnosis not present

## 2018-03-09 DIAGNOSIS — J3081 Allergic rhinitis due to animal (cat) (dog) hair and dander: Secondary | ICD-10-CM | POA: Diagnosis not present

## 2018-03-09 DIAGNOSIS — J301 Allergic rhinitis due to pollen: Secondary | ICD-10-CM | POA: Diagnosis not present

## 2018-03-09 DIAGNOSIS — J3089 Other allergic rhinitis: Secondary | ICD-10-CM | POA: Diagnosis not present

## 2018-03-21 DIAGNOSIS — J3089 Other allergic rhinitis: Secondary | ICD-10-CM | POA: Diagnosis not present

## 2018-03-21 DIAGNOSIS — J3081 Allergic rhinitis due to animal (cat) (dog) hair and dander: Secondary | ICD-10-CM | POA: Diagnosis not present

## 2018-03-21 DIAGNOSIS — J301 Allergic rhinitis due to pollen: Secondary | ICD-10-CM | POA: Diagnosis not present

## 2018-03-31 DIAGNOSIS — J301 Allergic rhinitis due to pollen: Secondary | ICD-10-CM | POA: Diagnosis not present

## 2018-03-31 DIAGNOSIS — J3089 Other allergic rhinitis: Secondary | ICD-10-CM | POA: Diagnosis not present

## 2018-03-31 DIAGNOSIS — J3081 Allergic rhinitis due to animal (cat) (dog) hair and dander: Secondary | ICD-10-CM | POA: Diagnosis not present

## 2018-04-04 DIAGNOSIS — J3089 Other allergic rhinitis: Secondary | ICD-10-CM | POA: Diagnosis not present

## 2018-04-04 DIAGNOSIS — J301 Allergic rhinitis due to pollen: Secondary | ICD-10-CM | POA: Diagnosis not present

## 2018-04-04 DIAGNOSIS — J3081 Allergic rhinitis due to animal (cat) (dog) hair and dander: Secondary | ICD-10-CM | POA: Diagnosis not present

## 2018-04-12 DIAGNOSIS — J3081 Allergic rhinitis due to animal (cat) (dog) hair and dander: Secondary | ICD-10-CM | POA: Diagnosis not present

## 2018-04-12 DIAGNOSIS — J301 Allergic rhinitis due to pollen: Secondary | ICD-10-CM | POA: Diagnosis not present

## 2018-04-12 DIAGNOSIS — J3089 Other allergic rhinitis: Secondary | ICD-10-CM | POA: Diagnosis not present

## 2018-04-14 DIAGNOSIS — J301 Allergic rhinitis due to pollen: Secondary | ICD-10-CM | POA: Diagnosis not present

## 2018-04-15 DIAGNOSIS — J3081 Allergic rhinitis due to animal (cat) (dog) hair and dander: Secondary | ICD-10-CM | POA: Diagnosis not present

## 2018-04-15 DIAGNOSIS — J3089 Other allergic rhinitis: Secondary | ICD-10-CM | POA: Diagnosis not present

## 2018-04-19 DIAGNOSIS — J3081 Allergic rhinitis due to animal (cat) (dog) hair and dander: Secondary | ICD-10-CM | POA: Diagnosis not present

## 2018-04-19 DIAGNOSIS — J3089 Other allergic rhinitis: Secondary | ICD-10-CM | POA: Diagnosis not present

## 2018-04-19 DIAGNOSIS — J301 Allergic rhinitis due to pollen: Secondary | ICD-10-CM | POA: Diagnosis not present

## 2018-04-27 DIAGNOSIS — J301 Allergic rhinitis due to pollen: Secondary | ICD-10-CM | POA: Diagnosis not present

## 2018-04-27 DIAGNOSIS — J3081 Allergic rhinitis due to animal (cat) (dog) hair and dander: Secondary | ICD-10-CM | POA: Diagnosis not present

## 2018-04-27 DIAGNOSIS — J3089 Other allergic rhinitis: Secondary | ICD-10-CM | POA: Diagnosis not present

## 2018-05-10 DIAGNOSIS — J301 Allergic rhinitis due to pollen: Secondary | ICD-10-CM | POA: Diagnosis not present

## 2018-05-10 DIAGNOSIS — J3089 Other allergic rhinitis: Secondary | ICD-10-CM | POA: Diagnosis not present

## 2018-05-10 DIAGNOSIS — J3081 Allergic rhinitis due to animal (cat) (dog) hair and dander: Secondary | ICD-10-CM | POA: Diagnosis not present

## 2018-05-13 ENCOUNTER — Ambulatory Visit: Payer: BLUE CROSS/BLUE SHIELD | Admitting: Urgent Care

## 2018-05-13 ENCOUNTER — Encounter: Payer: Self-pay | Admitting: Urgent Care

## 2018-05-13 VITALS — BP 169/94 | HR 78 | Temp 98.0°F | Resp 17 | Ht 68.0 in | Wt 210.0 lb

## 2018-05-13 DIAGNOSIS — Z9079 Acquired absence of other genital organ(s): Secondary | ICD-10-CM

## 2018-05-13 DIAGNOSIS — Z7289 Other problems related to lifestyle: Secondary | ICD-10-CM

## 2018-05-13 DIAGNOSIS — Z789 Other specified health status: Secondary | ICD-10-CM

## 2018-05-13 DIAGNOSIS — E860 Dehydration: Secondary | ICD-10-CM | POA: Diagnosis not present

## 2018-05-13 DIAGNOSIS — Z8546 Personal history of malignant neoplasm of prostate: Secondary | ICD-10-CM

## 2018-05-13 DIAGNOSIS — R8290 Unspecified abnormal findings in urine: Secondary | ICD-10-CM

## 2018-05-13 DIAGNOSIS — R300 Dysuria: Secondary | ICD-10-CM | POA: Diagnosis not present

## 2018-05-13 LAB — BASIC METABOLIC PANEL
BUN/Creatinine Ratio: 17 (ref 10–24)
BUN: 15 mg/dL (ref 8–27)
CO2: 19 mmol/L — AB (ref 20–29)
CREATININE: 0.9 mg/dL (ref 0.76–1.27)
Calcium: 9.5 mg/dL (ref 8.6–10.2)
Chloride: 98 mmol/L (ref 96–106)
GFR calc Af Amer: 105 mL/min/{1.73_m2} (ref >59)
GFR calc non Af Amer: 91 mL/min/{1.73_m2} (ref >59)
GLUCOSE: 91 mg/dL (ref 65–99)
Potassium: 4.4 mmol/L (ref 3.5–5.2)
SODIUM: 133 mmol/L — AB (ref 134–144)

## 2018-05-13 LAB — POCT URINALYSIS DIP (MANUAL ENTRY)
BILIRUBIN UA: NEGATIVE
Glucose, UA: NEGATIVE mg/dL
Ketones, POC UA: NEGATIVE mg/dL
Leukocytes, UA: NEGATIVE
Nitrite, UA: NEGATIVE
Protein Ur, POC: NEGATIVE mg/dL
RBC UA: NEGATIVE
UROBILINOGEN UA: 0.2 U/dL
pH, UA: 6 (ref 5.0–8.0)

## 2018-05-13 LAB — POC MICROSCOPIC URINALYSIS (UMFC): Mucus: ABSENT

## 2018-05-13 NOTE — Patient Instructions (Addendum)
Dehydration, Adult Dehydration is a condition in which there is not enough fluid or water in the body. This happens when you lose more fluids than you take in. Important organs, such as the kidneys, brain, and heart, cannot function without a proper amount of fluids. Any loss of fluids from the body can lead to dehydration. Dehydration can range from mild to severe. This condition should be treated right away to prevent it from becoming severe. What are the causes? This condition may be caused by:  Vomiting.  Diarrhea.  Excessive sweating, such as from heat exposure or exercise.  Not drinking enough fluid, especially: ? When ill. ? While doing activity that requires a lot of energy.  Excessive urination.  Fever.  Infection.  Certain medicines, such as medicines that cause the body to lose excess fluid (diuretics).  Inability to access safe drinking water.  Reduced physical ability to get adequate water and food.  What increases the risk? This condition is more likely to develop in people:  Who have a poorly controlled long-term (chronic) illness, such as diabetes, heart disease, or kidney disease.  Who are age 65 or older.  Who are disabled.  Who live in a place with high altitude.  Who play endurance sports.  What are the signs or symptoms? Symptoms of mild dehydration may include:  Thirst.  Dry lips.  Slightly dry mouth.  Dry, warm skin.  Dizziness. Symptoms of moderate dehydration may include:  Very dry mouth.  Muscle cramps.  Dark urine. Urine may be the color of tea.  Decreased urine production.  Decreased tear production.  Heartbeat that is irregular or faster than normal (palpitations).  Headache.  Light-headedness, especially when you stand up from a sitting position.  Fainting (syncope). Symptoms of severe dehydration may include:  Changes in skin, such as: ? Cold and clammy skin. ? Blotchy (mottled) or pale skin. ? Skin that does  not quickly return to normal after being lightly pinched and released (poor skin turgor).  Changes in body fluids, such as: ? Extreme thirst. ? No tear production. ? Inability to sweat when body temperature is high, such as in hot weather. ? Very little urine production.  Changes in vital signs, such as: ? Weak pulse. ? Pulse that is more than 100 beats a minute when sitting still. ? Rapid breathing. ? Low blood pressure.  Other changes, such as: ? Sunken eyes. ? Cold hands and feet. ? Confusion. ? Lack of energy (lethargy). ? Difficulty waking up from sleep. ? Short-term weight loss. ? Unconsciousness. How is this diagnosed? This condition is diagnosed based on your symptoms and a physical exam. Blood and urine tests may be done to help confirm the diagnosis. How is this treated? Treatment for this condition depends on the severity. Mild or moderate dehydration can often be treated at home. Treatment should be started right away. Do not wait until dehydration becomes severe. Severe dehydration is an emergency and it needs to be treated in a hospital. Treatment for mild dehydration may include:  Drinking more fluids.  Replacing salts and minerals in your blood (electrolytes) that you may have lost. Treatment for moderate dehydration may include:  Drinking an oral rehydration solution (ORS). This is a drink that helps you replace fluids and electrolytes (rehydrate). It can be found at pharmacies and retail stores. Treatment for severe dehydration may include:  Receiving fluids through an IV tube.  Receiving an electrolyte solution through a feeding tube that is passed through your nose   and into your stomach (nasogastric tube, or NG tube).  Correcting any abnormalities in electrolytes.  Treating the underlying cause of dehydration. Follow these instructions at home:  If directed by your health care provider, drink an ORS: ? Make an ORS by following instructions on the  package. ? Start by drinking small amounts, about  cup (120 mL) every 5-10 minutes. ? Slowly increase how much you drink until you have taken the amount recommended by your health care provider.  Drink enough clear fluid to keep your urine clear or pale yellow. If you were told to drink an ORS, finish the ORS first, then start slowly drinking other clear fluids. Drink fluids such as: ? Water. Do not drink only water. Doing that can lead to having too little salt (sodium) in the body (hyponatremia). ? Ice chips. ? Fruit juice that you have added water to (diluted fruit juice). ? Low-calorie sports drinks.  Avoid: ? Alcohol. ? Drinks that contain a lot of sugar. These include high-calorie sports drinks, fruit juice that is not diluted, and soda. ? Caffeine. ? Foods that are greasy or contain a lot of fat or sugar.  Take over-the-counter and prescription medicines only as told by your health care provider.  Do not take sodium tablets. This can lead to having too much sodium in the body (hypernatremia).  Eat foods that contain a healthy balance of electrolytes, such as bananas, oranges, potatoes, tomatoes, and spinach.  Keep all follow-up visits as told by your health care provider. This is important. Contact a health care provider if:  You have abdominal pain that: ? Gets worse. ? Stays in one area (localizes).  You have a rash.  You have a stiff neck.  You are more irritable than usual.  You are sleepier or more difficult to wake up than usual.  You feel weak or dizzy.  You feel very thirsty.  You have urinated only a small amount of very dark urine over 6-8 hours. Get help right away if:  You have symptoms of severe dehydration.  You cannot drink fluids without vomiting.  Your symptoms get worse with treatment.  You have a fever.  You have a severe headache.  You have vomiting or diarrhea that: ? Gets worse. ? Does not go away.  You have blood or green matter  (bile) in your vomit.  You have blood in your stool. This may cause stool to look black and tarry.  You have not urinated in 6-8 hours.  You faint.  Your heart rate while sitting still is over 100 beats a minute.  You have trouble breathing. This information is not intended to replace advice given to you by your health care provider. Make sure you discuss any questions you have with your health care provider. Document Released: 10/19/2005 Document Revised: 05/15/2016 Document Reviewed: 12/13/2015 Elsevier Interactive Patient Education  2018 [employer].     IF you received an x-ray today, you will receive an invoice from [facility]. Please contact [facility] at [phone] with questions or concerns regarding your invoice.   IF you received labwork today, you will receive an invoice from [facility]. Please contact [facility] at [phone] with questions or concerns regarding your invoice.   Our billing staff will not be able to assist you with questions regarding bills from these companies.  You will be contacted with the lab results as soon as they are available. The fastest way to get your results is to activate your My Chart account.  Instructions are located on the last page of this paperwork. If you have not heard from [place] regarding the results in 2 weeks, please contact this office.

## 2018-05-13 NOTE — Progress Notes (Signed)
MRN: [id number] DOB: [birth year]  Subjective:   Jose Calderon is a 62 y.o. male presenting for 1 week history of bilateral flank pain, malodorous urine.  Patient states that he was recently on vacation at the beach.  Admits that he drank a lot of alcohol and spent a lot of time outdoors.  In this past week he has felt general malaise and bilateral flank discomfort.  He has been trying to hydrate aggressively.  However he is worried that he has a kidney infection.  Denies fever, nausea, vomiting, belly pain, dysuria, hematuria.  He has abstained from alcohol since getting back.  Reports a history of prostate cancer and prostatectomy.  Jose Calderon has a current medication list which includes the following prescription(s): albuterol, azithromycin, nebivolol, and zolpidem. Also has No Known Allergies.  Jose Calderon  has a past medical history of Allergy, Hyperlipidemia, Hypertension, and Prostate cancer ([place]) (06/22/11). Also  has a past surgical history that includes Prostate biopsy (06/22/11) and Prostate biopsy (02/07/13).  Objective:   Vitals: BP (!) 169/94   Pulse 78   Temp 98 F (36.7 C) (Oral)   Resp 17   Ht 5\' 8"  (1.727 m)   Wt 210 lb (95.3 kg)   SpO2 98%   BMI 31.93 kg/m   Physical Exam  Constitutional: He is oriented to person, place, and time. He appears well-developed and well-nourished.  HENT:  Mouth/Throat: Oropharynx is clear and moist.  Eyes: No scleral icterus.  Cardiovascular: Normal rate, regular rhythm and intact distal pulses. Exam reveals no gallop and no friction rub.  No murmur heard. Pulmonary/Chest: No respiratory distress. He has no wheezes. He has no rales.  Abdominal: Soft. Bowel sounds are normal. He exhibits no distension and no mass. There is no tenderness. There is no rebound and no guarding.  No CVA tenderness.  Neurological: He is alert and oriented to person, place, and time.  Skin: Skin is warm and dry.  Psychiatric: He has a normal mood and affect.  Counseled  patient that Results for orders placed or performed in visit on 05/13/18 (from the past 24 hour(s))  POCT urinalysis dipstick     Status: Abnormal   Collection Time: 05/13/18  9:28 AM  Result Value Ref Range   Color, UA yellow yellow   Clarity, UA clear clear   Glucose, UA negative negative mg/dL   Bilirubin, UA negative negative   Ketones, POC UA negative negative mg/dL   Spec Grav, UA <=1.005 (A) 1.010 - 1.025   Blood, UA negative negative   pH, UA 6.0 5.0 - 8.0   Protein Ur, POC negative negative mg/dL   Urobilinogen, UA 0.2 0.2 or 1.0 E.U./dL   Nitrite, UA Negative Negative   Leukocytes, UA Negative Negative  POCT Microscopic Urinalysis ([facility])     Status: None   Collection Time: 05/13/18  9:33 AM  Result Value Ref Range   WBC,UR,HPF,POC None None WBC/hpf   RBC,UR,HPF,POC None None RBC/hpf   Bacteria None None, Too numerous to count   Mucus Absent Absent   Epithelial Cells, UR Per Microscopy None None, Too numerous to count cells/hpf    Assessment and Plan :   Dehydration  Malodorous urine - Plan: POCT urinalysis dipstick, POCT Microscopic Urinalysis ([facility]), Urine Culture, Basic metabolic panel  Alcohol use - Plan: Basic metabolic panel  History of prostate cancer  History of prostatectomy  He likely became severely dehydrated from his alcohol use and  the heat.  Recommended continued aggressive hydration, labs  pending.  Counseled on muscle cramps, AKI.  ER and return to clinic precautions reviewed.  Jaynee Eagles, PA-C [facility] [phone] 05/13/2018  9:39 AM

## 2018-05-14 LAB — URINE CULTURE: ORGANISM ID, BACTERIA: NO GROWTH

## 2018-05-16 DIAGNOSIS — J3089 Other allergic rhinitis: Secondary | ICD-10-CM | POA: Diagnosis not present

## 2018-05-16 DIAGNOSIS — J3081 Allergic rhinitis due to animal (cat) (dog) hair and dander: Secondary | ICD-10-CM | POA: Diagnosis not present

## 2018-05-16 DIAGNOSIS — J301 Allergic rhinitis due to pollen: Secondary | ICD-10-CM | POA: Diagnosis not present

## 2018-05-17 DIAGNOSIS — Z0000 Encounter for general adult medical examination without abnormal findings: Secondary | ICD-10-CM | POA: Diagnosis not present

## 2018-05-17 DIAGNOSIS — C61 Malignant neoplasm of prostate: Secondary | ICD-10-CM | POA: Diagnosis not present

## 2018-05-17 DIAGNOSIS — I10 Essential (primary) hypertension: Secondary | ICD-10-CM | POA: Diagnosis not present

## 2018-05-24 DIAGNOSIS — J3081 Allergic rhinitis due to animal (cat) (dog) hair and dander: Secondary | ICD-10-CM | POA: Diagnosis not present

## 2018-05-24 DIAGNOSIS — J3089 Other allergic rhinitis: Secondary | ICD-10-CM | POA: Diagnosis not present

## 2018-05-24 DIAGNOSIS — J301 Allergic rhinitis due to pollen: Secondary | ICD-10-CM | POA: Diagnosis not present

## 2018-05-25 DIAGNOSIS — Z0000 Encounter for general adult medical examination without abnormal findings: Secondary | ICD-10-CM | POA: Diagnosis not present

## 2018-05-25 DIAGNOSIS — M545 Low back pain: Secondary | ICD-10-CM | POA: Diagnosis not present

## 2018-05-25 DIAGNOSIS — M6530 Trigger finger, unspecified finger: Secondary | ICD-10-CM | POA: Diagnosis not present

## 2018-05-25 DIAGNOSIS — Z23 Encounter for immunization: Secondary | ICD-10-CM | POA: Diagnosis not present

## 2018-05-25 DIAGNOSIS — M5136 Other intervertebral disc degeneration, lumbar region: Secondary | ICD-10-CM | POA: Diagnosis not present

## 2018-05-30 DIAGNOSIS — J3089 Other allergic rhinitis: Secondary | ICD-10-CM | POA: Diagnosis not present

## 2018-05-30 DIAGNOSIS — M546 Pain in thoracic spine: Secondary | ICD-10-CM | POA: Diagnosis not present

## 2018-05-30 DIAGNOSIS — J3081 Allergic rhinitis due to animal (cat) (dog) hair and dander: Secondary | ICD-10-CM | POA: Diagnosis not present

## 2018-05-30 DIAGNOSIS — J301 Allergic rhinitis due to pollen: Secondary | ICD-10-CM | POA: Diagnosis not present

## 2018-05-31 DIAGNOSIS — M6530 Trigger finger, unspecified finger: Secondary | ICD-10-CM | POA: Diagnosis not present

## 2018-05-31 DIAGNOSIS — M79641 Pain in right hand: Secondary | ICD-10-CM | POA: Diagnosis not present

## 2018-06-02 ENCOUNTER — Other Ambulatory Visit: Payer: Self-pay | Admitting: Internal Medicine

## 2018-06-02 DIAGNOSIS — M545 Low back pain, unspecified: Secondary | ICD-10-CM

## 2018-06-03 DIAGNOSIS — J3081 Allergic rhinitis due to animal (cat) (dog) hair and dander: Secondary | ICD-10-CM | POA: Diagnosis not present

## 2018-06-03 DIAGNOSIS — J3089 Other allergic rhinitis: Secondary | ICD-10-CM | POA: Diagnosis not present

## 2018-06-03 DIAGNOSIS — J301 Allergic rhinitis due to pollen: Secondary | ICD-10-CM | POA: Diagnosis not present

## 2018-06-13 ENCOUNTER — Ambulatory Visit
Admission: RE | Admit: 2018-06-13 | Discharge: 2018-06-13 | Disposition: A | Discharge status: Home / Self Care | Payer: BLUE CROSS/BLUE SHIELD | Source: Ambulatory Visit | Attending: Internal Medicine | Admitting: Internal Medicine

## 2018-06-13 DIAGNOSIS — M545 Low back pain, unspecified: Secondary | ICD-10-CM

## 2018-06-13 DIAGNOSIS — M48061 Spinal stenosis, lumbar region without neurogenic claudication: Secondary | ICD-10-CM | POA: Diagnosis not present

## 2018-06-14 DIAGNOSIS — J3089 Other allergic rhinitis: Secondary | ICD-10-CM | POA: Diagnosis not present

## 2018-06-14 DIAGNOSIS — J301 Allergic rhinitis due to pollen: Secondary | ICD-10-CM | POA: Diagnosis not present

## 2018-06-14 DIAGNOSIS — J3081 Allergic rhinitis due to animal (cat) (dog) hair and dander: Secondary | ICD-10-CM | POA: Diagnosis not present

## 2018-06-27 DIAGNOSIS — J301 Allergic rhinitis due to pollen: Secondary | ICD-10-CM | POA: Diagnosis not present

## 2018-06-27 DIAGNOSIS — J3081 Allergic rhinitis due to animal (cat) (dog) hair and dander: Secondary | ICD-10-CM | POA: Diagnosis not present

## 2018-06-27 DIAGNOSIS — J3089 Other allergic rhinitis: Secondary | ICD-10-CM | POA: Diagnosis not present

## 2018-07-05 DIAGNOSIS — J3089 Other allergic rhinitis: Secondary | ICD-10-CM | POA: Diagnosis not present

## 2018-07-05 DIAGNOSIS — J3081 Allergic rhinitis due to animal (cat) (dog) hair and dander: Secondary | ICD-10-CM | POA: Diagnosis not present

## 2018-07-05 DIAGNOSIS — J301 Allergic rhinitis due to pollen: Secondary | ICD-10-CM | POA: Diagnosis not present

## 2018-07-06 DIAGNOSIS — M545 Low back pain: Secondary | ICD-10-CM | POA: Diagnosis not present

## 2018-07-06 DIAGNOSIS — G8929 Other chronic pain: Secondary | ICD-10-CM | POA: Diagnosis not present

## 2018-07-11 DIAGNOSIS — J3081 Allergic rhinitis due to animal (cat) (dog) hair and dander: Secondary | ICD-10-CM | POA: Diagnosis not present

## 2018-07-11 DIAGNOSIS — J301 Allergic rhinitis due to pollen: Secondary | ICD-10-CM | POA: Diagnosis not present

## 2018-07-11 DIAGNOSIS — J3089 Other allergic rhinitis: Secondary | ICD-10-CM | POA: Diagnosis not present

## 2018-07-25 DIAGNOSIS — J3081 Allergic rhinitis due to animal (cat) (dog) hair and dander: Secondary | ICD-10-CM | POA: Diagnosis not present

## 2018-07-25 DIAGNOSIS — J3089 Other allergic rhinitis: Secondary | ICD-10-CM | POA: Diagnosis not present

## 2018-07-25 DIAGNOSIS — J301 Allergic rhinitis due to pollen: Secondary | ICD-10-CM | POA: Diagnosis not present

## 2018-07-28 DIAGNOSIS — J301 Allergic rhinitis due to pollen: Secondary | ICD-10-CM | POA: Diagnosis not present

## 2018-07-28 DIAGNOSIS — J3081 Allergic rhinitis due to animal (cat) (dog) hair and dander: Secondary | ICD-10-CM | POA: Diagnosis not present

## 2018-07-28 DIAGNOSIS — J3089 Other allergic rhinitis: Secondary | ICD-10-CM | POA: Diagnosis not present

## 2018-08-02 DIAGNOSIS — J3081 Allergic rhinitis due to animal (cat) (dog) hair and dander: Secondary | ICD-10-CM | POA: Diagnosis not present

## 2018-08-02 DIAGNOSIS — J3089 Other allergic rhinitis: Secondary | ICD-10-CM | POA: Diagnosis not present

## 2018-08-02 DIAGNOSIS — J301 Allergic rhinitis due to pollen: Secondary | ICD-10-CM | POA: Diagnosis not present

## 2018-08-16 DIAGNOSIS — J3089 Other allergic rhinitis: Secondary | ICD-10-CM | POA: Diagnosis not present

## 2018-08-16 DIAGNOSIS — J301 Allergic rhinitis due to pollen: Secondary | ICD-10-CM | POA: Diagnosis not present

## 2018-08-16 DIAGNOSIS — J3081 Allergic rhinitis due to animal (cat) (dog) hair and dander: Secondary | ICD-10-CM | POA: Diagnosis not present

## 2018-08-24 DIAGNOSIS — J301 Allergic rhinitis due to pollen: Secondary | ICD-10-CM | POA: Diagnosis not present

## 2018-08-24 DIAGNOSIS — J3081 Allergic rhinitis due to animal (cat) (dog) hair and dander: Secondary | ICD-10-CM | POA: Diagnosis not present

## 2018-08-24 DIAGNOSIS — J3089 Other allergic rhinitis: Secondary | ICD-10-CM | POA: Diagnosis not present

## 2018-08-31 DIAGNOSIS — J3089 Other allergic rhinitis: Secondary | ICD-10-CM | POA: Diagnosis not present

## 2018-08-31 DIAGNOSIS — J3081 Allergic rhinitis due to animal (cat) (dog) hair and dander: Secondary | ICD-10-CM | POA: Diagnosis not present

## 2018-08-31 DIAGNOSIS — J301 Allergic rhinitis due to pollen: Secondary | ICD-10-CM | POA: Diagnosis not present

## 2018-09-14 DIAGNOSIS — J3089 Other allergic rhinitis: Secondary | ICD-10-CM | POA: Diagnosis not present

## 2018-09-14 DIAGNOSIS — J3081 Allergic rhinitis due to animal (cat) (dog) hair and dander: Secondary | ICD-10-CM | POA: Diagnosis not present

## 2018-09-14 DIAGNOSIS — J301 Allergic rhinitis due to pollen: Secondary | ICD-10-CM | POA: Diagnosis not present

## 2018-09-20 DIAGNOSIS — J3081 Allergic rhinitis due to animal (cat) (dog) hair and dander: Secondary | ICD-10-CM | POA: Diagnosis not present

## 2018-09-20 DIAGNOSIS — J301 Allergic rhinitis due to pollen: Secondary | ICD-10-CM | POA: Diagnosis not present

## 2018-09-20 DIAGNOSIS — J3089 Other allergic rhinitis: Secondary | ICD-10-CM | POA: Diagnosis not present

## 2018-09-28 DIAGNOSIS — J3081 Allergic rhinitis due to animal (cat) (dog) hair and dander: Secondary | ICD-10-CM | POA: Diagnosis not present

## 2018-09-28 DIAGNOSIS — J301 Allergic rhinitis due to pollen: Secondary | ICD-10-CM | POA: Diagnosis not present

## 2018-09-28 DIAGNOSIS — J3089 Other allergic rhinitis: Secondary | ICD-10-CM | POA: Diagnosis not present

## 2018-10-05 DIAGNOSIS — D1801 Hemangioma of skin and subcutaneous tissue: Secondary | ICD-10-CM | POA: Diagnosis not present

## 2018-10-05 DIAGNOSIS — J3081 Allergic rhinitis due to animal (cat) (dog) hair and dander: Secondary | ICD-10-CM | POA: Diagnosis not present

## 2018-10-05 DIAGNOSIS — D225 Melanocytic nevi of trunk: Secondary | ICD-10-CM | POA: Diagnosis not present

## 2018-10-05 DIAGNOSIS — L570 Actinic keratosis: Secondary | ICD-10-CM | POA: Diagnosis not present

## 2018-10-05 DIAGNOSIS — L821 Other seborrheic keratosis: Secondary | ICD-10-CM | POA: Diagnosis not present

## 2018-10-05 DIAGNOSIS — L814 Other melanin hyperpigmentation: Secondary | ICD-10-CM | POA: Diagnosis not present

## 2018-10-05 DIAGNOSIS — J3089 Other allergic rhinitis: Secondary | ICD-10-CM | POA: Diagnosis not present

## 2018-10-05 DIAGNOSIS — J301 Allergic rhinitis due to pollen: Secondary | ICD-10-CM | POA: Diagnosis not present

## 2018-10-12 DIAGNOSIS — J301 Allergic rhinitis due to pollen: Secondary | ICD-10-CM | POA: Diagnosis not present

## 2018-10-12 DIAGNOSIS — J3089 Other allergic rhinitis: Secondary | ICD-10-CM | POA: Diagnosis not present

## 2018-10-12 DIAGNOSIS — J3081 Allergic rhinitis due to animal (cat) (dog) hair and dander: Secondary | ICD-10-CM | POA: Diagnosis not present

## 2018-10-18 DIAGNOSIS — J3081 Allergic rhinitis due to animal (cat) (dog) hair and dander: Secondary | ICD-10-CM | POA: Diagnosis not present

## 2018-10-18 DIAGNOSIS — J3089 Other allergic rhinitis: Secondary | ICD-10-CM | POA: Diagnosis not present

## 2018-10-18 DIAGNOSIS — J301 Allergic rhinitis due to pollen: Secondary | ICD-10-CM | POA: Diagnosis not present

## 2018-10-24 DIAGNOSIS — J301 Allergic rhinitis due to pollen: Secondary | ICD-10-CM | POA: Diagnosis not present

## 2018-10-24 DIAGNOSIS — J3081 Allergic rhinitis due to animal (cat) (dog) hair and dander: Secondary | ICD-10-CM | POA: Diagnosis not present

## 2018-10-24 DIAGNOSIS — J3089 Other allergic rhinitis: Secondary | ICD-10-CM | POA: Diagnosis not present

## 2018-11-04 DIAGNOSIS — J301 Allergic rhinitis due to pollen: Secondary | ICD-10-CM | POA: Diagnosis not present

## 2018-11-04 DIAGNOSIS — J3089 Other allergic rhinitis: Secondary | ICD-10-CM | POA: Diagnosis not present

## 2018-11-04 DIAGNOSIS — J3081 Allergic rhinitis due to animal (cat) (dog) hair and dander: Secondary | ICD-10-CM | POA: Diagnosis not present

## 2018-11-07 DIAGNOSIS — J301 Allergic rhinitis due to pollen: Secondary | ICD-10-CM | POA: Diagnosis not present

## 2018-11-08 DIAGNOSIS — J301 Allergic rhinitis due to pollen: Secondary | ICD-10-CM | POA: Diagnosis not present

## 2018-11-08 DIAGNOSIS — J3081 Allergic rhinitis due to animal (cat) (dog) hair and dander: Secondary | ICD-10-CM | POA: Diagnosis not present

## 2018-11-08 DIAGNOSIS — J3089 Other allergic rhinitis: Secondary | ICD-10-CM | POA: Diagnosis not present

## 2018-11-15 DIAGNOSIS — J301 Allergic rhinitis due to pollen: Secondary | ICD-10-CM | POA: Diagnosis not present

## 2018-11-15 DIAGNOSIS — J3081 Allergic rhinitis due to animal (cat) (dog) hair and dander: Secondary | ICD-10-CM | POA: Diagnosis not present

## 2018-11-15 DIAGNOSIS — J3089 Other allergic rhinitis: Secondary | ICD-10-CM | POA: Diagnosis not present

## 2018-11-29 DIAGNOSIS — J3081 Allergic rhinitis due to animal (cat) (dog) hair and dander: Secondary | ICD-10-CM | POA: Diagnosis not present

## 2018-11-29 DIAGNOSIS — J301 Allergic rhinitis due to pollen: Secondary | ICD-10-CM | POA: Diagnosis not present

## 2018-11-29 DIAGNOSIS — J3089 Other allergic rhinitis: Secondary | ICD-10-CM | POA: Diagnosis not present

## 2018-12-06 DIAGNOSIS — J301 Allergic rhinitis due to pollen: Secondary | ICD-10-CM | POA: Diagnosis not present

## 2018-12-06 DIAGNOSIS — J3089 Other allergic rhinitis: Secondary | ICD-10-CM | POA: Diagnosis not present

## 2018-12-06 DIAGNOSIS — J3081 Allergic rhinitis due to animal (cat) (dog) hair and dander: Secondary | ICD-10-CM | POA: Diagnosis not present

## 2018-12-13 DIAGNOSIS — J3081 Allergic rhinitis due to animal (cat) (dog) hair and dander: Secondary | ICD-10-CM | POA: Diagnosis not present

## 2018-12-13 DIAGNOSIS — J301 Allergic rhinitis due to pollen: Secondary | ICD-10-CM | POA: Diagnosis not present

## 2018-12-13 DIAGNOSIS — J3089 Other allergic rhinitis: Secondary | ICD-10-CM | POA: Diagnosis not present

## 2018-12-20 DIAGNOSIS — J3081 Allergic rhinitis due to animal (cat) (dog) hair and dander: Secondary | ICD-10-CM | POA: Diagnosis not present

## 2018-12-20 DIAGNOSIS — J3089 Other allergic rhinitis: Secondary | ICD-10-CM | POA: Diagnosis not present

## 2018-12-20 DIAGNOSIS — J301 Allergic rhinitis due to pollen: Secondary | ICD-10-CM | POA: Diagnosis not present

## 2018-12-27 DIAGNOSIS — J3089 Other allergic rhinitis: Secondary | ICD-10-CM | POA: Diagnosis not present

## 2018-12-27 DIAGNOSIS — J3081 Allergic rhinitis due to animal (cat) (dog) hair and dander: Secondary | ICD-10-CM | POA: Diagnosis not present

## 2018-12-27 DIAGNOSIS — J301 Allergic rhinitis due to pollen: Secondary | ICD-10-CM | POA: Diagnosis not present

## 2019-01-03 DIAGNOSIS — J3081 Allergic rhinitis due to animal (cat) (dog) hair and dander: Secondary | ICD-10-CM | POA: Diagnosis not present

## 2019-01-03 DIAGNOSIS — J301 Allergic rhinitis due to pollen: Secondary | ICD-10-CM | POA: Diagnosis not present

## 2019-01-03 DIAGNOSIS — J3089 Other allergic rhinitis: Secondary | ICD-10-CM | POA: Diagnosis not present

## 2019-01-10 DIAGNOSIS — J301 Allergic rhinitis due to pollen: Secondary | ICD-10-CM | POA: Diagnosis not present

## 2019-01-10 DIAGNOSIS — J3089 Other allergic rhinitis: Secondary | ICD-10-CM | POA: Diagnosis not present

## 2019-01-10 DIAGNOSIS — J3081 Allergic rhinitis due to animal (cat) (dog) hair and dander: Secondary | ICD-10-CM | POA: Diagnosis not present

## 2019-01-16 DIAGNOSIS — J301 Allergic rhinitis due to pollen: Secondary | ICD-10-CM | POA: Diagnosis not present

## 2019-01-16 DIAGNOSIS — R05 Cough: Secondary | ICD-10-CM | POA: Diagnosis not present

## 2019-01-16 DIAGNOSIS — J3089 Other allergic rhinitis: Secondary | ICD-10-CM | POA: Diagnosis not present

## 2019-01-16 DIAGNOSIS — J3081 Allergic rhinitis due to animal (cat) (dog) hair and dander: Secondary | ICD-10-CM | POA: Diagnosis not present

## 2019-01-16 DIAGNOSIS — K219 Gastro-esophageal reflux disease without esophagitis: Secondary | ICD-10-CM | POA: Diagnosis not present

## 2019-02-07 DIAGNOSIS — J3081 Allergic rhinitis due to animal (cat) (dog) hair and dander: Secondary | ICD-10-CM | POA: Diagnosis not present

## 2019-02-07 DIAGNOSIS — J3089 Other allergic rhinitis: Secondary | ICD-10-CM | POA: Diagnosis not present

## 2019-02-07 DIAGNOSIS — J301 Allergic rhinitis due to pollen: Secondary | ICD-10-CM | POA: Diagnosis not present

## 2019-02-21 DIAGNOSIS — J3081 Allergic rhinitis due to animal (cat) (dog) hair and dander: Secondary | ICD-10-CM | POA: Diagnosis not present

## 2019-02-21 DIAGNOSIS — J301 Allergic rhinitis due to pollen: Secondary | ICD-10-CM | POA: Diagnosis not present

## 2019-02-21 DIAGNOSIS — J3089 Other allergic rhinitis: Secondary | ICD-10-CM | POA: Diagnosis not present

## 2019-03-06 DIAGNOSIS — J301 Allergic rhinitis due to pollen: Secondary | ICD-10-CM | POA: Diagnosis not present

## 2019-03-06 DIAGNOSIS — J3081 Allergic rhinitis due to animal (cat) (dog) hair and dander: Secondary | ICD-10-CM | POA: Diagnosis not present

## 2019-03-06 DIAGNOSIS — J3089 Other allergic rhinitis: Secondary | ICD-10-CM | POA: Diagnosis not present

## 2019-03-20 DIAGNOSIS — J3081 Allergic rhinitis due to animal (cat) (dog) hair and dander: Secondary | ICD-10-CM | POA: Diagnosis not present

## 2019-03-20 DIAGNOSIS — J3089 Other allergic rhinitis: Secondary | ICD-10-CM | POA: Diagnosis not present

## 2019-03-20 DIAGNOSIS — J301 Allergic rhinitis due to pollen: Secondary | ICD-10-CM | POA: Diagnosis not present

## 2019-03-28 DIAGNOSIS — J301 Allergic rhinitis due to pollen: Secondary | ICD-10-CM | POA: Diagnosis not present

## 2019-03-28 DIAGNOSIS — J3081 Allergic rhinitis due to animal (cat) (dog) hair and dander: Secondary | ICD-10-CM | POA: Diagnosis not present

## 2019-03-28 DIAGNOSIS — J3089 Other allergic rhinitis: Secondary | ICD-10-CM | POA: Diagnosis not present

## 2019-04-05 DIAGNOSIS — J301 Allergic rhinitis due to pollen: Secondary | ICD-10-CM | POA: Diagnosis not present

## 2019-04-05 DIAGNOSIS — J3089 Other allergic rhinitis: Secondary | ICD-10-CM | POA: Diagnosis not present

## 2019-04-05 DIAGNOSIS — J3081 Allergic rhinitis due to animal (cat) (dog) hair and dander: Secondary | ICD-10-CM | POA: Diagnosis not present

## 2019-04-18 DIAGNOSIS — J301 Allergic rhinitis due to pollen: Secondary | ICD-10-CM | POA: Diagnosis not present

## 2019-04-18 DIAGNOSIS — J3089 Other allergic rhinitis: Secondary | ICD-10-CM | POA: Diagnosis not present

## 2019-04-18 DIAGNOSIS — J3081 Allergic rhinitis due to animal (cat) (dog) hair and dander: Secondary | ICD-10-CM | POA: Diagnosis not present

## 2019-04-27 DIAGNOSIS — J3089 Other allergic rhinitis: Secondary | ICD-10-CM | POA: Diagnosis not present

## 2019-04-27 DIAGNOSIS — J3081 Allergic rhinitis due to animal (cat) (dog) hair and dander: Secondary | ICD-10-CM | POA: Diagnosis not present

## 2019-04-27 DIAGNOSIS — J301 Allergic rhinitis due to pollen: Secondary | ICD-10-CM | POA: Diagnosis not present

## 2019-05-02 DIAGNOSIS — J3081 Allergic rhinitis due to animal (cat) (dog) hair and dander: Secondary | ICD-10-CM | POA: Diagnosis not present

## 2019-05-02 DIAGNOSIS — J301 Allergic rhinitis due to pollen: Secondary | ICD-10-CM | POA: Diagnosis not present

## 2019-05-02 DIAGNOSIS — J3089 Other allergic rhinitis: Secondary | ICD-10-CM | POA: Diagnosis not present

## 2019-05-09 DIAGNOSIS — J3089 Other allergic rhinitis: Secondary | ICD-10-CM | POA: Diagnosis not present

## 2019-05-09 DIAGNOSIS — J301 Allergic rhinitis due to pollen: Secondary | ICD-10-CM | POA: Diagnosis not present

## 2019-05-09 DIAGNOSIS — J3081 Allergic rhinitis due to animal (cat) (dog) hair and dander: Secondary | ICD-10-CM | POA: Diagnosis not present

## 2019-05-12 DIAGNOSIS — H2513 Age-related nuclear cataract, bilateral: Secondary | ICD-10-CM | POA: Diagnosis not present

## 2019-05-12 DIAGNOSIS — H53001 Unspecified amblyopia, right eye: Secondary | ICD-10-CM | POA: Diagnosis not present

## 2019-05-12 DIAGNOSIS — H524 Presbyopia: Secondary | ICD-10-CM | POA: Diagnosis not present

## 2019-05-12 DIAGNOSIS — M79641 Pain in right hand: Secondary | ICD-10-CM | POA: Diagnosis not present

## 2019-05-12 DIAGNOSIS — M6530 Trigger finger, unspecified finger: Secondary | ICD-10-CM | POA: Diagnosis not present

## 2019-05-12 DIAGNOSIS — H5213 Myopia, bilateral: Secondary | ICD-10-CM | POA: Diagnosis not present

## 2019-05-16 DIAGNOSIS — J301 Allergic rhinitis due to pollen: Secondary | ICD-10-CM | POA: Diagnosis not present

## 2019-05-16 DIAGNOSIS — J3089 Other allergic rhinitis: Secondary | ICD-10-CM | POA: Diagnosis not present

## 2019-05-16 DIAGNOSIS — J3081 Allergic rhinitis due to animal (cat) (dog) hair and dander: Secondary | ICD-10-CM | POA: Diagnosis not present

## 2019-05-23 DIAGNOSIS — J301 Allergic rhinitis due to pollen: Secondary | ICD-10-CM | POA: Diagnosis not present

## 2019-05-23 DIAGNOSIS — J3089 Other allergic rhinitis: Secondary | ICD-10-CM | POA: Diagnosis not present

## 2019-05-23 DIAGNOSIS — J3081 Allergic rhinitis due to animal (cat) (dog) hair and dander: Secondary | ICD-10-CM | POA: Diagnosis not present

## 2019-05-24 DIAGNOSIS — Z125 Encounter for screening for malignant neoplasm of prostate: Secondary | ICD-10-CM | POA: Diagnosis not present

## 2019-05-24 DIAGNOSIS — Z20828 Contact with and (suspected) exposure to other viral communicable diseases: Secondary | ICD-10-CM | POA: Diagnosis not present

## 2019-05-24 DIAGNOSIS — E039 Hypothyroidism, unspecified: Secondary | ICD-10-CM | POA: Diagnosis not present

## 2019-05-24 DIAGNOSIS — J301 Allergic rhinitis due to pollen: Secondary | ICD-10-CM | POA: Diagnosis not present

## 2019-05-24 DIAGNOSIS — Z0000 Encounter for general adult medical examination without abnormal findings: Secondary | ICD-10-CM | POA: Diagnosis not present

## 2019-05-24 DIAGNOSIS — I10 Essential (primary) hypertension: Secondary | ICD-10-CM | POA: Diagnosis not present

## 2019-05-24 DIAGNOSIS — E785 Hyperlipidemia, unspecified: Secondary | ICD-10-CM | POA: Diagnosis not present

## 2019-05-24 DIAGNOSIS — Z79899 Other long term (current) drug therapy: Secondary | ICD-10-CM | POA: Diagnosis not present

## 2019-05-25 DIAGNOSIS — J3081 Allergic rhinitis due to animal (cat) (dog) hair and dander: Secondary | ICD-10-CM | POA: Diagnosis not present

## 2019-05-25 DIAGNOSIS — J3089 Other allergic rhinitis: Secondary | ICD-10-CM | POA: Diagnosis not present

## 2019-05-30 DIAGNOSIS — J3089 Other allergic rhinitis: Secondary | ICD-10-CM | POA: Diagnosis not present

## 2019-05-30 DIAGNOSIS — J3081 Allergic rhinitis due to animal (cat) (dog) hair and dander: Secondary | ICD-10-CM | POA: Diagnosis not present

## 2019-05-30 DIAGNOSIS — J301 Allergic rhinitis due to pollen: Secondary | ICD-10-CM | POA: Diagnosis not present

## 2019-05-31 DIAGNOSIS — Z0000 Encounter for general adult medical examination without abnormal findings: Secondary | ICD-10-CM | POA: Diagnosis not present

## 2019-05-31 DIAGNOSIS — I10 Essential (primary) hypertension: Secondary | ICD-10-CM | POA: Diagnosis not present

## 2019-05-31 DIAGNOSIS — C61 Malignant neoplasm of prostate: Secondary | ICD-10-CM | POA: Diagnosis not present

## 2019-05-31 DIAGNOSIS — E785 Hyperlipidemia, unspecified: Secondary | ICD-10-CM | POA: Diagnosis not present

## 2019-06-08 DIAGNOSIS — J3081 Allergic rhinitis due to animal (cat) (dog) hair and dander: Secondary | ICD-10-CM | POA: Diagnosis not present

## 2019-06-08 DIAGNOSIS — J301 Allergic rhinitis due to pollen: Secondary | ICD-10-CM | POA: Diagnosis not present

## 2019-06-08 DIAGNOSIS — J3089 Other allergic rhinitis: Secondary | ICD-10-CM | POA: Diagnosis not present

## 2019-06-11 IMAGING — MR MR LUMBAR SPINE W/O CM
4 of 5 series · 17 of 48 positions shown · non-contrast
Comparison: Prior radiograph from 05/25/2018.

CLINICAL DATA: Initial evaluation for mid to lower back pain since
injury 10 weeks ago, worsening.

EXAM:
MRI LUMBAR SPINE WITHOUT CONTRAST
TECHNIQUE: Multiplanar, multisequence MR imaging of the lumbar spine was
performed. No intravenous contrast was administered.

[Series 5: T2 · sagittal · 4.0mm · 0.73mm/px · 6 of 15 slices shown (1 of 2)]
[im 1/15]
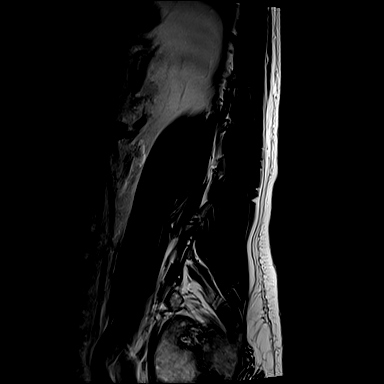
[im 3/15]
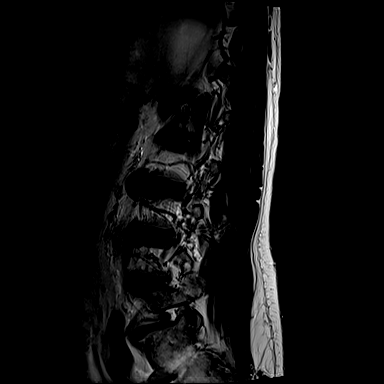
[im 6/15]
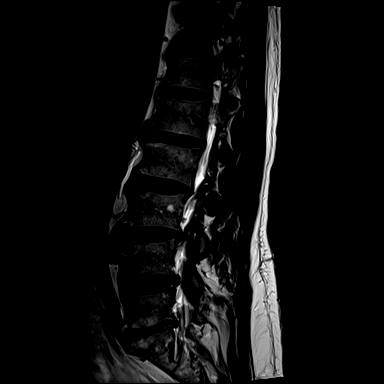
[im 9/15]
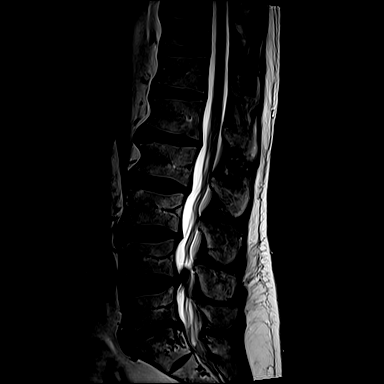
[im 12/15]
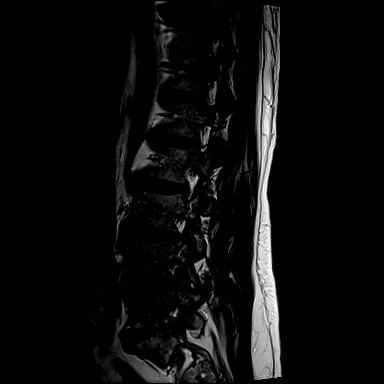
[im 15/15]
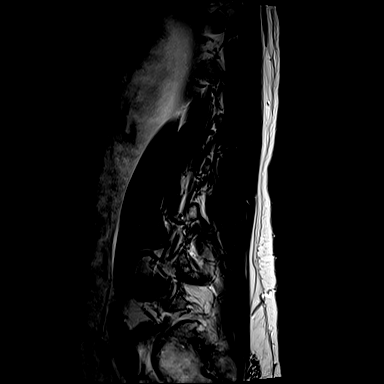

[Series 6: T1 · sagittal · 4.0mm · 0.73mm/px · 3 of 15 slices shown (1 of 2)]
[im 3/15]
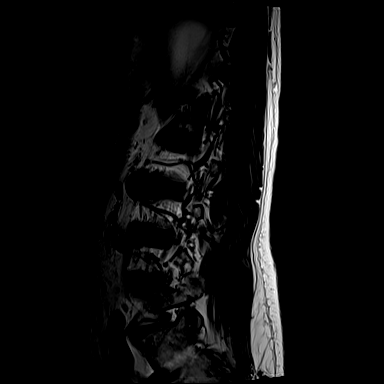
[im 9/15]
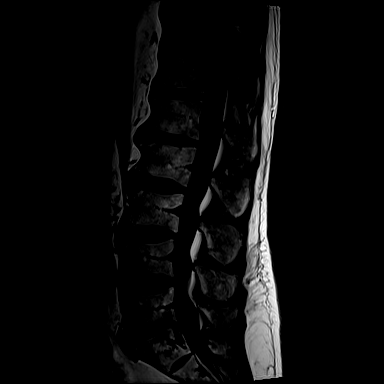
[im 15/15]
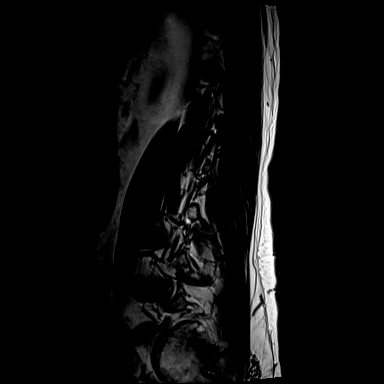

[Series 10: T1 · axial · 4.0mm · 0.28mm/px · z∈[-51,+111]mm · 3 of 39 slices shown (2 of 2)]
[im 6/39]
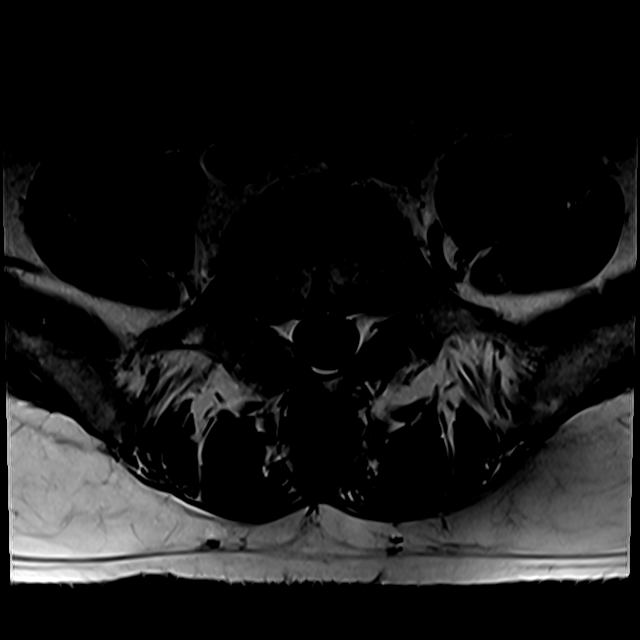
[im 20/39]
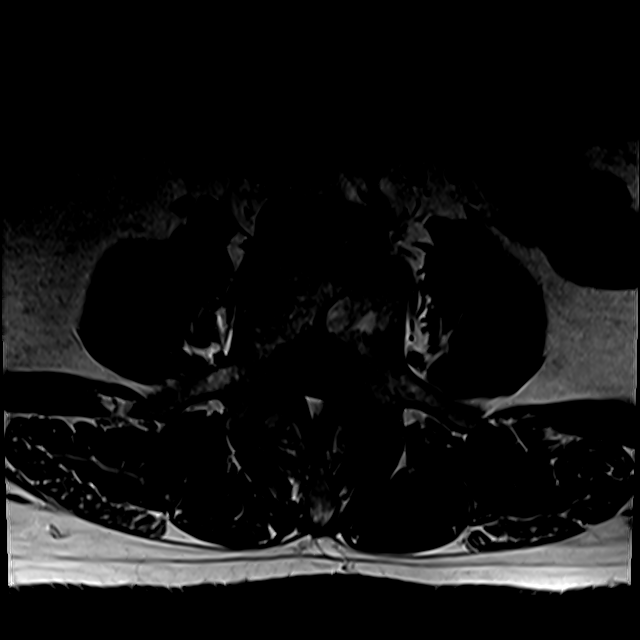
[im 33/39]
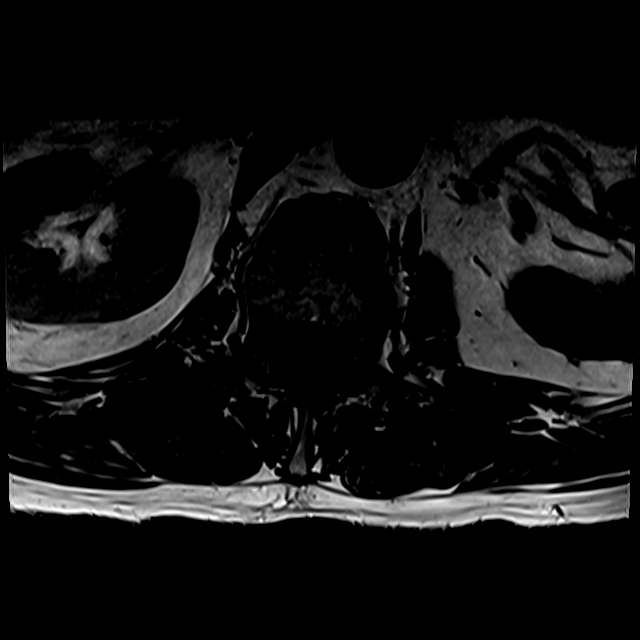

[Series 13: T2 · axial · 4.0mm · 0.28mm/px · z∈[-76,+111]mm · 5 of 39 slices shown (2 of 2)]
[im 1/39]
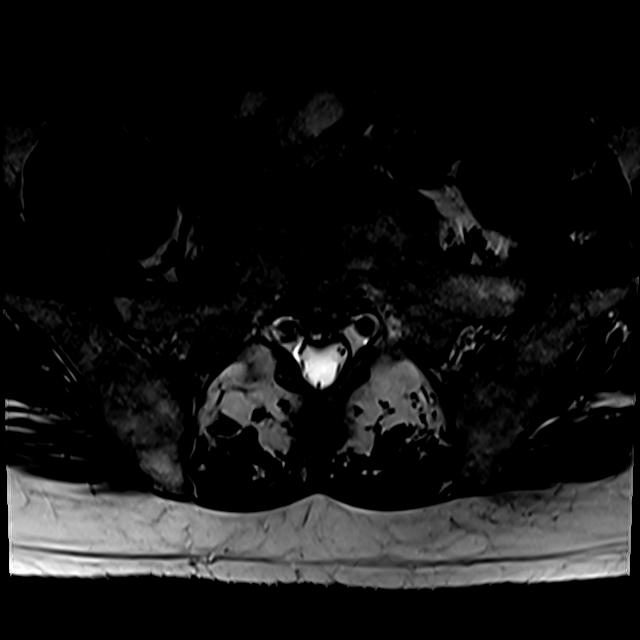
[im 6/39]
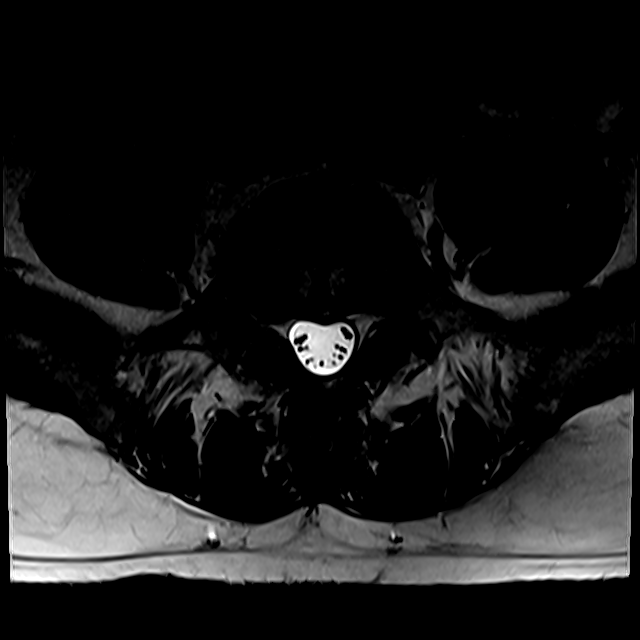
[im 11/39]
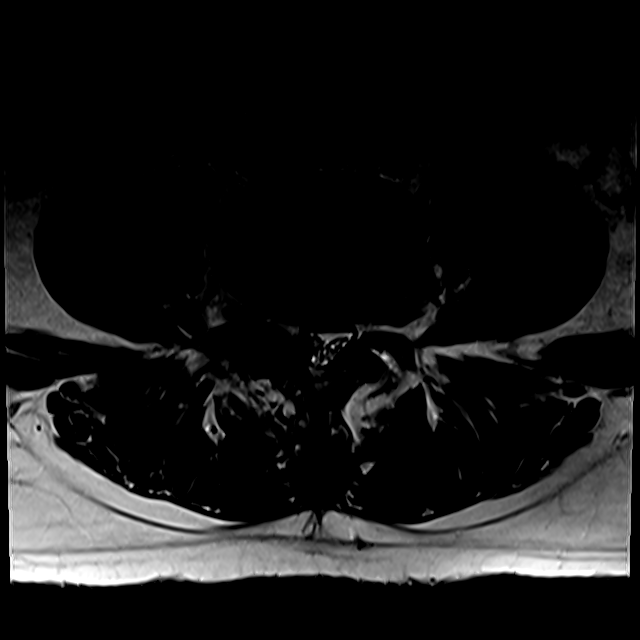
[im 20/39]
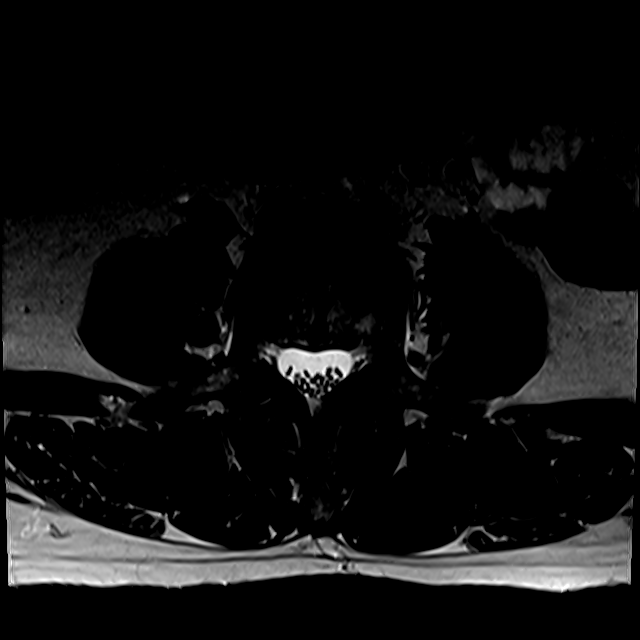
[im 33/39]
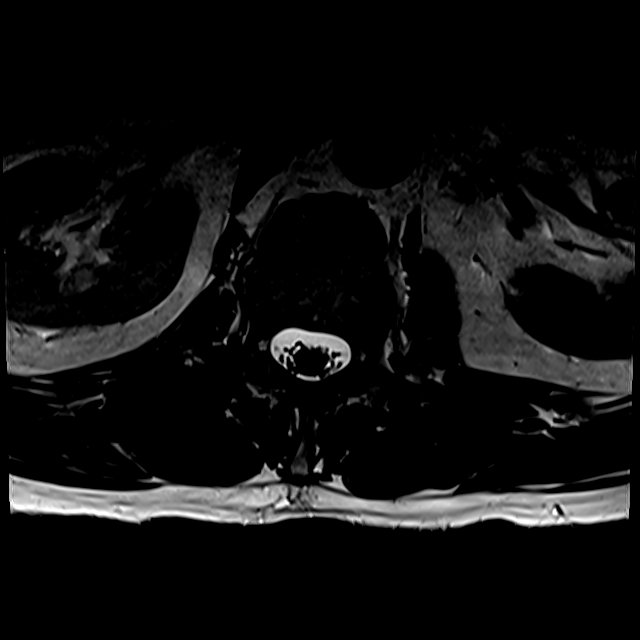

[17 of 48 positions shown; findings below may reference images not displayed]

FINDINGS: Segmentation: Normal segmentation. Lowest well-formed disc labeled
the L5-S1 level.

Alignment: 4 mm retrolisthesis of L5 on S1. Straightening of the
normal lumbar lordosis.

Vertebrae: Vertebral body heights maintained without evidence for
acute or chronic fracture. Bone marrow signal intensity within
normal limits. Few small benign hemangiomas noted. No worrisome
osseous lesions. No abnormal marrow edema.

Conus medullaris and cauda equina: Conus extends to the L2 level.
Conus and cauda equina appear normal.

Paraspinal and other soft tissues: Paraspinous soft tissues within
normal limits. Visualized visceral structures unremarkable.

Disc levels:

L1-2: Chronic intervertebral disc space narrowing with mild diffuse
disc bulge and disc desiccation. No significant stenosis.

L2-3: Chronic intervertebral disc space narrowing with diffuse disc
bulge and disc desiccation. No significant stenosis.

L3-4: Chronic intervertebral disc space narrowing with diffuse disc
bulge and disc desiccation. Mild facet and ligament flavum
hypertrophy. No significant spinal stenosis. Mild bilateral L3
foraminal narrowing without impingement.

L4-5: Mild diffuse disc bulge with disc desiccation. Moderate
bilateral facet hypertrophy. There is soft tissue density within the
right lateral epidural space just anterior to the right L4-5 facet
(series 13, image 29). Finding also seen on sagittal T1 weighted
sequence (series 6, image 6). Finding suspected to at least
partially reflect extruded disc material, likely superimposed on
ligamentous hypertrophy. Finding potentially affect either the right
L4 or descending L5 nerve roots. Resultant moderate to severe right
lateral recess narrowing with mild narrowing of the central canal.
Mild right lateral recess stenosis.

L5-S1: Trace retrolisthesis. Chronic intervertebral disc space
narrowing with diffuse disc bulge and disc desiccation. Chronic
reactive endplate changes with marginal endplate osteophytic
spurring. Resultant broad posterior disc osteophyte, slightly
asymmetric to the right closely approximates the descending S1 nerve
roots without frank impingement. Mild facet hypertrophy. Mild
narrowing of the right lateral recess. Mild bilateral L5 foraminal
stenosis without impingement.
IMPRESSION: 1. Heterogeneous soft tissue density within the right lateral
epidural space at the level of L4-5, favored to reflect a
combination of extruded disc material and/or ligamentum flavum
hypertrophy. Finding could potentially effect either the right L4 or
descending L5 nerve roots.
2. Right eccentric disc osteophyte at L5-S1 with resultant mild
right lateral recess and bilateral L5 foraminal stenosis.
3. Mild noncompressive disc bulging at L1-2 through L3-4 without
impingement.

## 2019-06-20 DIAGNOSIS — J3081 Allergic rhinitis due to animal (cat) (dog) hair and dander: Secondary | ICD-10-CM | POA: Diagnosis not present

## 2019-06-20 DIAGNOSIS — J3089 Other allergic rhinitis: Secondary | ICD-10-CM | POA: Diagnosis not present

## 2019-06-20 DIAGNOSIS — J301 Allergic rhinitis due to pollen: Secondary | ICD-10-CM | POA: Diagnosis not present

## 2019-06-27 DIAGNOSIS — J301 Allergic rhinitis due to pollen: Secondary | ICD-10-CM | POA: Diagnosis not present

## 2019-06-27 DIAGNOSIS — J3081 Allergic rhinitis due to animal (cat) (dog) hair and dander: Secondary | ICD-10-CM | POA: Diagnosis not present

## 2019-06-27 DIAGNOSIS — J3089 Other allergic rhinitis: Secondary | ICD-10-CM | POA: Diagnosis not present

## 2019-07-04 DIAGNOSIS — J3081 Allergic rhinitis due to animal (cat) (dog) hair and dander: Secondary | ICD-10-CM | POA: Diagnosis not present

## 2019-07-04 DIAGNOSIS — J3089 Other allergic rhinitis: Secondary | ICD-10-CM | POA: Diagnosis not present

## 2019-07-04 DIAGNOSIS — J301 Allergic rhinitis due to pollen: Secondary | ICD-10-CM | POA: Diagnosis not present

## 2019-07-12 DIAGNOSIS — J301 Allergic rhinitis due to pollen: Secondary | ICD-10-CM | POA: Diagnosis not present

## 2019-07-12 DIAGNOSIS — J3081 Allergic rhinitis due to animal (cat) (dog) hair and dander: Secondary | ICD-10-CM | POA: Diagnosis not present

## 2019-07-12 DIAGNOSIS — J3089 Other allergic rhinitis: Secondary | ICD-10-CM | POA: Diagnosis not present

## 2019-07-26 DIAGNOSIS — Z20828 Contact with and (suspected) exposure to other viral communicable diseases: Secondary | ICD-10-CM | POA: Diagnosis not present

## 2019-07-26 DIAGNOSIS — R079 Chest pain, unspecified: Secondary | ICD-10-CM | POA: Diagnosis not present

## 2019-07-27 DIAGNOSIS — J301 Allergic rhinitis due to pollen: Secondary | ICD-10-CM | POA: Diagnosis not present

## 2019-07-27 DIAGNOSIS — J3089 Other allergic rhinitis: Secondary | ICD-10-CM | POA: Diagnosis not present

## 2019-07-27 DIAGNOSIS — J3081 Allergic rhinitis due to animal (cat) (dog) hair and dander: Secondary | ICD-10-CM | POA: Diagnosis not present

## 2019-08-01 DIAGNOSIS — J3089 Other allergic rhinitis: Secondary | ICD-10-CM | POA: Diagnosis not present

## 2019-08-01 DIAGNOSIS — J301 Allergic rhinitis due to pollen: Secondary | ICD-10-CM | POA: Diagnosis not present

## 2019-08-01 DIAGNOSIS — J3081 Allergic rhinitis due to animal (cat) (dog) hair and dander: Secondary | ICD-10-CM | POA: Diagnosis not present

## 2019-08-08 DIAGNOSIS — J3081 Allergic rhinitis due to animal (cat) (dog) hair and dander: Secondary | ICD-10-CM | POA: Diagnosis not present

## 2019-08-08 DIAGNOSIS — J3089 Other allergic rhinitis: Secondary | ICD-10-CM | POA: Diagnosis not present

## 2019-08-08 DIAGNOSIS — J301 Allergic rhinitis due to pollen: Secondary | ICD-10-CM | POA: Diagnosis not present

## 2019-08-15 DIAGNOSIS — J301 Allergic rhinitis due to pollen: Secondary | ICD-10-CM | POA: Diagnosis not present

## 2019-08-15 DIAGNOSIS — J3081 Allergic rhinitis due to animal (cat) (dog) hair and dander: Secondary | ICD-10-CM | POA: Diagnosis not present

## 2019-08-15 DIAGNOSIS — J3089 Other allergic rhinitis: Secondary | ICD-10-CM | POA: Diagnosis not present

## 2019-08-25 DIAGNOSIS — J301 Allergic rhinitis due to pollen: Secondary | ICD-10-CM | POA: Diagnosis not present

## 2019-08-25 DIAGNOSIS — K219 Gastro-esophageal reflux disease without esophagitis: Secondary | ICD-10-CM | POA: Diagnosis not present

## 2019-08-25 DIAGNOSIS — J3089 Other allergic rhinitis: Secondary | ICD-10-CM | POA: Diagnosis not present

## 2019-08-25 DIAGNOSIS — J3081 Allergic rhinitis due to animal (cat) (dog) hair and dander: Secondary | ICD-10-CM | POA: Diagnosis not present

## 2019-08-28 DIAGNOSIS — E785 Hyperlipidemia, unspecified: Secondary | ICD-10-CM | POA: Diagnosis not present

## 2019-08-29 DIAGNOSIS — J3089 Other allergic rhinitis: Secondary | ICD-10-CM | POA: Diagnosis not present

## 2019-08-29 DIAGNOSIS — J301 Allergic rhinitis due to pollen: Secondary | ICD-10-CM | POA: Diagnosis not present

## 2019-08-29 DIAGNOSIS — J3081 Allergic rhinitis due to animal (cat) (dog) hair and dander: Secondary | ICD-10-CM | POA: Diagnosis not present

## 2019-08-31 DIAGNOSIS — C61 Malignant neoplasm of prostate: Secondary | ICD-10-CM | POA: Diagnosis not present

## 2019-08-31 DIAGNOSIS — Z23 Encounter for immunization: Secondary | ICD-10-CM | POA: Diagnosis not present

## 2019-08-31 DIAGNOSIS — E785 Hyperlipidemia, unspecified: Secondary | ICD-10-CM | POA: Diagnosis not present

## 2019-08-31 DIAGNOSIS — I10 Essential (primary) hypertension: Secondary | ICD-10-CM | POA: Diagnosis not present

## 2019-09-05 DIAGNOSIS — J301 Allergic rhinitis due to pollen: Secondary | ICD-10-CM | POA: Diagnosis not present

## 2019-09-05 DIAGNOSIS — J3089 Other allergic rhinitis: Secondary | ICD-10-CM | POA: Diagnosis not present

## 2019-09-05 DIAGNOSIS — J3081 Allergic rhinitis due to animal (cat) (dog) hair and dander: Secondary | ICD-10-CM | POA: Diagnosis not present

## 2019-09-05 DIAGNOSIS — Z1159 Encounter for screening for other viral diseases: Secondary | ICD-10-CM | POA: Diagnosis not present

## 2019-09-12 ENCOUNTER — Other Ambulatory Visit: Payer: Self-pay

## 2019-09-12 DIAGNOSIS — J3081 Allergic rhinitis due to animal (cat) (dog) hair and dander: Secondary | ICD-10-CM | POA: Diagnosis not present

## 2019-09-12 DIAGNOSIS — J301 Allergic rhinitis due to pollen: Secondary | ICD-10-CM | POA: Diagnosis not present

## 2019-09-12 DIAGNOSIS — Z20822 Contact with and (suspected) exposure to covid-19: Secondary | ICD-10-CM

## 2019-09-12 DIAGNOSIS — J3089 Other allergic rhinitis: Secondary | ICD-10-CM | POA: Diagnosis not present

## 2019-09-13 LAB — NOVEL CORONAVIRUS, NAA: SARS-CoV-2, NAA: NOT DETECTED

## 2019-09-25 ENCOUNTER — Other Ambulatory Visit: Payer: Self-pay

## 2019-09-25 DIAGNOSIS — Z20822 Contact with and (suspected) exposure to covid-19: Secondary | ICD-10-CM

## 2019-09-26 LAB — NOVEL CORONAVIRUS, NAA: SARS-CoV-2, NAA: NOT DETECTED

## 2019-10-04 DIAGNOSIS — J3081 Allergic rhinitis due to animal (cat) (dog) hair and dander: Secondary | ICD-10-CM | POA: Diagnosis not present

## 2019-10-04 DIAGNOSIS — J301 Allergic rhinitis due to pollen: Secondary | ICD-10-CM | POA: Diagnosis not present

## 2019-10-04 DIAGNOSIS — J3089 Other allergic rhinitis: Secondary | ICD-10-CM | POA: Diagnosis not present

## 2019-10-05 ENCOUNTER — Other Ambulatory Visit: Payer: Self-pay

## 2019-10-05 DIAGNOSIS — Z20822 Contact with and (suspected) exposure to covid-19: Secondary | ICD-10-CM

## 2019-10-09 LAB — NOVEL CORONAVIRUS, NAA: SARS-CoV-2, NAA: NOT DETECTED

## 2019-10-11 DIAGNOSIS — L821 Other seborrheic keratosis: Secondary | ICD-10-CM | POA: Diagnosis not present

## 2019-10-11 DIAGNOSIS — L814 Other melanin hyperpigmentation: Secondary | ICD-10-CM | POA: Diagnosis not present

## 2019-10-11 DIAGNOSIS — L570 Actinic keratosis: Secondary | ICD-10-CM | POA: Diagnosis not present

## 2019-10-11 DIAGNOSIS — D225 Melanocytic nevi of trunk: Secondary | ICD-10-CM | POA: Diagnosis not present

## 2019-10-11 DIAGNOSIS — J301 Allergic rhinitis due to pollen: Secondary | ICD-10-CM | POA: Diagnosis not present

## 2019-10-11 DIAGNOSIS — D1801 Hemangioma of skin and subcutaneous tissue: Secondary | ICD-10-CM | POA: Diagnosis not present

## 2019-10-11 DIAGNOSIS — J3089 Other allergic rhinitis: Secondary | ICD-10-CM | POA: Diagnosis not present

## 2019-10-11 DIAGNOSIS — J3081 Allergic rhinitis due to animal (cat) (dog) hair and dander: Secondary | ICD-10-CM | POA: Diagnosis not present

## 2019-10-11 DIAGNOSIS — C44729 Squamous cell carcinoma of skin of left lower limb, including hip: Secondary | ICD-10-CM | POA: Diagnosis not present

## 2019-10-17 DIAGNOSIS — J301 Allergic rhinitis due to pollen: Secondary | ICD-10-CM | POA: Diagnosis not present

## 2019-10-17 DIAGNOSIS — J3089 Other allergic rhinitis: Secondary | ICD-10-CM | POA: Diagnosis not present

## 2019-10-17 DIAGNOSIS — J3081 Allergic rhinitis due to animal (cat) (dog) hair and dander: Secondary | ICD-10-CM | POA: Diagnosis not present

## 2019-10-18 ENCOUNTER — Other Ambulatory Visit: Payer: Self-pay

## 2019-10-18 ENCOUNTER — Ambulatory Visit: Payer: BC Managed Care – PPO | Attending: Internal Medicine

## 2019-10-18 DIAGNOSIS — Z20822 Contact with and (suspected) exposure to covid-19: Secondary | ICD-10-CM

## 2019-10-18 DIAGNOSIS — Z20828 Contact with and (suspected) exposure to other viral communicable diseases: Secondary | ICD-10-CM | POA: Diagnosis not present

## 2019-10-20 LAB — NOVEL CORONAVIRUS, NAA: SARS-CoV-2, NAA: NOT DETECTED

## 2019-11-07 DIAGNOSIS — J3089 Other allergic rhinitis: Secondary | ICD-10-CM | POA: Diagnosis not present

## 2019-11-07 DIAGNOSIS — J301 Allergic rhinitis due to pollen: Secondary | ICD-10-CM | POA: Diagnosis not present

## 2019-11-21 ENCOUNTER — Ambulatory Visit: Payer: BC Managed Care – PPO | Attending: Internal Medicine

## 2019-11-21 DIAGNOSIS — Z20822 Contact with and (suspected) exposure to covid-19: Secondary | ICD-10-CM | POA: Diagnosis not present

## 2019-11-22 LAB — NOVEL CORONAVIRUS, NAA: SARS-CoV-2, NAA: NOT DETECTED

## 2019-11-23 DIAGNOSIS — J301 Allergic rhinitis due to pollen: Secondary | ICD-10-CM | POA: Diagnosis not present

## 2019-11-23 DIAGNOSIS — J3089 Other allergic rhinitis: Secondary | ICD-10-CM | POA: Diagnosis not present

## 2019-11-23 DIAGNOSIS — J3081 Allergic rhinitis due to animal (cat) (dog) hair and dander: Secondary | ICD-10-CM | POA: Diagnosis not present

## 2019-11-28 DIAGNOSIS — J301 Allergic rhinitis due to pollen: Secondary | ICD-10-CM | POA: Diagnosis not present

## 2019-11-28 DIAGNOSIS — J3081 Allergic rhinitis due to animal (cat) (dog) hair and dander: Secondary | ICD-10-CM | POA: Diagnosis not present

## 2019-11-28 DIAGNOSIS — J3089 Other allergic rhinitis: Secondary | ICD-10-CM | POA: Diagnosis not present

## 2019-12-05 DIAGNOSIS — J301 Allergic rhinitis due to pollen: Secondary | ICD-10-CM | POA: Diagnosis not present

## 2019-12-05 DIAGNOSIS — J3081 Allergic rhinitis due to animal (cat) (dog) hair and dander: Secondary | ICD-10-CM | POA: Diagnosis not present

## 2019-12-05 DIAGNOSIS — C44729 Squamous cell carcinoma of skin of left lower limb, including hip: Secondary | ICD-10-CM | POA: Diagnosis not present

## 2019-12-05 DIAGNOSIS — J3089 Other allergic rhinitis: Secondary | ICD-10-CM | POA: Diagnosis not present

## 2019-12-11 ENCOUNTER — Ambulatory Visit: Payer: BC Managed Care – PPO | Attending: Internal Medicine

## 2019-12-11 DIAGNOSIS — Z20822 Contact with and (suspected) exposure to covid-19: Secondary | ICD-10-CM | POA: Diagnosis not present

## 2019-12-12 DIAGNOSIS — J301 Allergic rhinitis due to pollen: Secondary | ICD-10-CM | POA: Diagnosis not present

## 2019-12-12 DIAGNOSIS — J3089 Other allergic rhinitis: Secondary | ICD-10-CM | POA: Diagnosis not present

## 2019-12-12 DIAGNOSIS — J3081 Allergic rhinitis due to animal (cat) (dog) hair and dander: Secondary | ICD-10-CM | POA: Diagnosis not present

## 2019-12-12 LAB — NOVEL CORONAVIRUS, NAA: SARS-CoV-2, NAA: NOT DETECTED

## 2019-12-19 DIAGNOSIS — J3089 Other allergic rhinitis: Secondary | ICD-10-CM | POA: Diagnosis not present

## 2019-12-19 DIAGNOSIS — J3081 Allergic rhinitis due to animal (cat) (dog) hair and dander: Secondary | ICD-10-CM | POA: Diagnosis not present

## 2019-12-19 DIAGNOSIS — J301 Allergic rhinitis due to pollen: Secondary | ICD-10-CM | POA: Diagnosis not present

## 2019-12-26 DIAGNOSIS — J3081 Allergic rhinitis due to animal (cat) (dog) hair and dander: Secondary | ICD-10-CM | POA: Diagnosis not present

## 2019-12-26 DIAGNOSIS — J3089 Other allergic rhinitis: Secondary | ICD-10-CM | POA: Diagnosis not present

## 2019-12-26 DIAGNOSIS — J301 Allergic rhinitis due to pollen: Secondary | ICD-10-CM | POA: Diagnosis not present

## 2020-01-02 DIAGNOSIS — J3081 Allergic rhinitis due to animal (cat) (dog) hair and dander: Secondary | ICD-10-CM | POA: Diagnosis not present

## 2020-01-02 DIAGNOSIS — J3089 Other allergic rhinitis: Secondary | ICD-10-CM | POA: Diagnosis not present

## 2020-01-02 DIAGNOSIS — J301 Allergic rhinitis due to pollen: Secondary | ICD-10-CM | POA: Diagnosis not present

## 2020-01-09 DIAGNOSIS — J3081 Allergic rhinitis due to animal (cat) (dog) hair and dander: Secondary | ICD-10-CM | POA: Diagnosis not present

## 2020-01-09 DIAGNOSIS — J3089 Other allergic rhinitis: Secondary | ICD-10-CM | POA: Diagnosis not present

## 2020-01-09 DIAGNOSIS — J301 Allergic rhinitis due to pollen: Secondary | ICD-10-CM | POA: Diagnosis not present

## 2020-01-15 DIAGNOSIS — J301 Allergic rhinitis due to pollen: Secondary | ICD-10-CM | POA: Diagnosis not present

## 2020-01-16 DIAGNOSIS — J3081 Allergic rhinitis due to animal (cat) (dog) hair and dander: Secondary | ICD-10-CM | POA: Diagnosis not present

## 2020-01-16 DIAGNOSIS — J3089 Other allergic rhinitis: Secondary | ICD-10-CM | POA: Diagnosis not present

## 2020-01-24 DIAGNOSIS — J3089 Other allergic rhinitis: Secondary | ICD-10-CM | POA: Diagnosis not present

## 2020-01-24 DIAGNOSIS — J301 Allergic rhinitis due to pollen: Secondary | ICD-10-CM | POA: Diagnosis not present

## 2020-01-24 DIAGNOSIS — J3081 Allergic rhinitis due to animal (cat) (dog) hair and dander: Secondary | ICD-10-CM | POA: Diagnosis not present

## 2020-01-31 DIAGNOSIS — J3081 Allergic rhinitis due to animal (cat) (dog) hair and dander: Secondary | ICD-10-CM | POA: Diagnosis not present

## 2020-01-31 DIAGNOSIS — J301 Allergic rhinitis due to pollen: Secondary | ICD-10-CM | POA: Diagnosis not present

## 2020-01-31 DIAGNOSIS — J3089 Other allergic rhinitis: Secondary | ICD-10-CM | POA: Diagnosis not present

## 2020-07-09 ENCOUNTER — Other Ambulatory Visit: Payer: Self-pay

## 2020-07-09 ENCOUNTER — Other Ambulatory Visit: Payer: BC Managed Care – PPO

## 2020-07-09 DIAGNOSIS — Z20822 Contact with and (suspected) exposure to covid-19: Secondary | ICD-10-CM

## 2020-07-10 LAB — NOVEL CORONAVIRUS, NAA: SARS-CoV-2, NAA: NOT DETECTED

## 2020-07-10 LAB — SARS-COV-2, NAA 2 DAY TAT

## 2020-08-16 ENCOUNTER — Ambulatory Visit: Payer: BC Managed Care – PPO | Admitting: Surgical

## 2020-08-22 ENCOUNTER — Ambulatory Visit (INDEPENDENT_AMBULATORY_CARE_PROVIDER_SITE_OTHER): Payer: Medicare Other | Admitting: Orthopedic Surgery

## 2020-08-22 ENCOUNTER — Ambulatory Visit: Payer: Self-pay

## 2020-08-22 DIAGNOSIS — M25532 Pain in left wrist: Secondary | ICD-10-CM | POA: Diagnosis not present

## 2020-08-25 ENCOUNTER — Encounter: Payer: Self-pay | Admitting: Orthopedic Surgery

## 2020-08-25 NOTE — Progress Notes (Signed)
Office Visit Note   Patient: Jose Calderon           Date of Birth: 04-13-1956           MRN: [id number] Visit Date: 08/22/2020 Requested by: Jani Gravel, MD [facility] [facility] [place],  [place] [id number] PCP: Jani Gravel, MD  Subjective: Chief Complaint  Patient presents with  . Left Wrist - Pain    HPI: [name] is a 64 year old patient who is right-hand dominant who fell getting out of his boat 8 weeks ago.  Landed onto his hand.  Describes left wrist pain.  Started wearing a brace last week.  He states that his wrist is slowly improving.  Never had prior injury to the wrist.  He is not taking any medication for the problem.              ROS: All systems reviewed are negative as they relate to the chief complaint within the history of present illness.  Patient denies  fevers or chills.   Assessment & Plan: Visit Diagnoses:  1. Pain in left wrist     Plan: Impression is healed radial styloid fracture left wrist with no evidence of injury to the scaphoid or scapholunate ligament.  Plan is continued immobilization as needed for pain relief.  I think in general the fracture is healed and he should continue to improve.  Would recommend discontinuing splint use after the next 2 weeks maximum.  Follow-up as needed.  Follow-Up Instructions: No follow-ups on file.   Orders:  Orders Placed This Encounter  Procedures  . XR Wrist Complete Left   No orders of the defined types were placed in this encounter.     Procedures: No procedures performed   Clinical Data: No additional findings.  Objective: Vital Signs: There were no vitals taken for this visit.  Physical Exam:   Constitutional: Patient appears well-developed HEENT:  Head: Normocephalic Eyes:EOM are normal Neck: Normal range of motion Cardiovascular: Normal rate Pulmonary/chest: Effort normal Neurologic: Patient is alert Skin: Skin is warm Psychiatric: Patient has normal mood and affect    Ortho Exam:  Ortho exam demonstrates tenderness over the radial styloid on the left-hand side.  EPL FPL interosseous strength is intact.  No snuffbox tenderness.  Wrist range of motion pretty reasonable except for the last 10 degrees of wrist extension compared to the right-hand side.  Grip strength is also symmetric.  Radial pulse is intact.  No ulnar-sided wrist tenderness.  Specialty Comments:  No specialty comments available.  Imaging: No results found.   PMFS History: Patient Active Problem List   Diagnosis Date Noted  . Prostate cancer ([place]) 06/22/2011   Past Medical History:  Diagnosis Date  . Allergy    amlodipine =rash  . Hyperlipidemia   . Hypertension   . Prostate cancer ([place]) 06/22/11   Adenocarcinoma    Family History  Problem Relation Age of Onset  . Cancer Brother        prostate /radical prostatectomy  . Cancer Mother     Past Surgical History:  Procedure Laterality Date  . PROSTATE BIOPSY  06/22/11   Adenocarcinoma  . PROSTATE BIOPSY  02/07/13   2nd biopsy=Adenocarcinoma   Social History   Occupational History    Comment: Camera operator  Tobacco Use  . Smoking status: Never Smoker  . Smokeless tobacco: Never Used  Substance and Sexual Activity  . Alcohol use: Yes    Comment: beer  4-5 12 ounce cans daily  .  Drug use: No  . Sexual activity: Yes

## 2020-12-30 ENCOUNTER — Other Ambulatory Visit: Payer: Medicare Other

## 2020-12-30 DIAGNOSIS — Z20822 Contact with and (suspected) exposure to covid-19: Secondary | ICD-10-CM

## 2021-01-01 LAB — NOVEL CORONAVIRUS, NAA: SARS-CoV-2, NAA: NOT DETECTED

## 2021-01-01 LAB — SARS-COV-2, NAA 2 DAY TAT

## 2021-05-09 ENCOUNTER — Encounter: Payer: Self-pay | Admitting: Gastroenterology

## 2021-07-30 ENCOUNTER — Other Ambulatory Visit: Payer: Self-pay | Admitting: Family Medicine

## 2021-07-30 DIAGNOSIS — E785 Hyperlipidemia, unspecified: Secondary | ICD-10-CM

## 2021-07-30 DIAGNOSIS — Z8249 Family history of ischemic heart disease and other diseases of the circulatory system: Secondary | ICD-10-CM

## 2021-08-19 ENCOUNTER — Ambulatory Visit
Admission: RE | Admit: 2021-08-19 | Discharge: 2021-08-19 | Disposition: A | Discharge status: Home / Self Care | Payer: No Typology Code available for payment source | Source: Ambulatory Visit | Attending: Family Medicine | Admitting: Family Medicine

## 2021-08-19 ENCOUNTER — Encounter: Payer: Self-pay | Admitting: Gastroenterology

## 2021-08-19 DIAGNOSIS — E785 Hyperlipidemia, unspecified: Secondary | ICD-10-CM

## 2021-08-19 DIAGNOSIS — Z8249 Family history of ischemic heart disease and other diseases of the circulatory system: Secondary | ICD-10-CM

## 2021-09-23 ENCOUNTER — Ambulatory Visit (AMBULATORY_SURGERY_CENTER): Payer: Self-pay

## 2021-09-23 ENCOUNTER — Other Ambulatory Visit: Payer: Self-pay

## 2021-09-23 VITALS — Ht 69.0 in | Wt 200.0 lb

## 2021-09-23 DIAGNOSIS — Z86010 Personal history of colonic polyps: Secondary | ICD-10-CM

## 2021-09-23 MED ORDER — PEG-KCL-NACL-NASULF-NA ASC-C 100 G PO SOLR: 1.0000 | Freq: Once | ORAL | 0 refills | Status: AC

## 2021-09-23 NOTE — Progress Notes (Signed)
Denies allergies to eggs or soy products. Denies complication of anesthesia or sedation. Denies use of weight loss medication. Denies use of O2.   Emmi instructions given for colonoscopy.  

## 2021-09-29 ENCOUNTER — Encounter: Payer: Self-pay | Admitting: Gastroenterology

## 2021-10-07 ENCOUNTER — Ambulatory Visit (AMBULATORY_SURGERY_CENTER): Payer: Medicare Other | Admitting: Gastroenterology

## 2021-10-07 ENCOUNTER — Encounter: Payer: Self-pay | Admitting: Gastroenterology

## 2021-10-07 VITALS — BP 133/81 | HR 72 | Temp 97.3°F | Resp 12 | Ht 68.0 in | Wt 200.0 lb

## 2021-10-07 DIAGNOSIS — D124 Benign neoplasm of descending colon: Secondary | ICD-10-CM | POA: Diagnosis not present

## 2021-10-07 DIAGNOSIS — D123 Benign neoplasm of transverse colon: Secondary | ICD-10-CM

## 2021-10-07 DIAGNOSIS — Z86010 Personal history of colonic polyps: Secondary | ICD-10-CM | POA: Diagnosis not present

## 2021-10-07 DIAGNOSIS — D122 Benign neoplasm of ascending colon: Secondary | ICD-10-CM

## 2021-10-07 MED ORDER — SODIUM CHLORIDE 0.9 % IV SOLN: 500.0000 mL | Freq: Once | INTRAVENOUS | Status: DC

## 2021-10-07 NOTE — Patient Instructions (Signed)
Thank you for allowing [place] to care for you today! Await final results, approximately 2 weeks.  Will make recommendation at that time for future colonoscopy. Resume previous diet and medications today. Return to normal daily activities tomorrow.      YOU HAD AN ENDOSCOPIC PROCEDURE TODAY AT [facility]:   Refer to the procedure report that was given to you for any specific questions about what was found during the examination.  If the procedure report does not answer your questions, please call your gastroenterologist to clarify.  If you requested that your care partner not be given the details of your procedure findings, then the procedure report has been included in a sealed envelope for you to review at your convenience later.  YOU SHOULD EXPECT: Some feelings of bloating in the abdomen. Passage of more gas than usual.  Walking can help get rid of the air that was put into your GI tract during the procedure and reduce the bloating. If you had a lower endoscopy (such as a colonoscopy or flexible sigmoidoscopy) you may notice spotting of blood in your stool or on the toilet paper. If you underwent a bowel prep for your procedure, you may not have a normal bowel movement for a few days.  Please Note:  You might notice some irritation and congestion in your nose or some drainage.  This is from the oxygen used during your procedure.  There is no need for concern and it should clear up in a day or so.  SYMPTOMS TO REPORT IMMEDIATELY:  Following lower endoscopy (colonoscopy or flexible sigmoidoscopy):  Excessive amounts of blood in the stool  Significant tenderness or worsening of abdominal pains  Swelling of the abdomen that is new, acute  Fever of 100F or higher    For urgent or emergent issues, a gastroenterologist can be reached at any hour by calling [phone]. Do not use MyChart messaging for urgent concerns.    DIET:  We do recommend a small meal at first, but  then you may proceed to your regular diet.  Drink plenty of fluids but you should avoid alcoholic beverages for 24 hours.  ACTIVITY:  You should plan to take it easy for the rest of today and you should NOT DRIVE or use heavy machinery until tomorrow (because of the sedation medicines used during the test).    FOLLOW UP: Our staff will call the number listed on your records 48-72 hours following your procedure to check on you and address any questions or concerns that you may have regarding the information given to you following your procedure. If we do not reach you, we will leave a message.  We will attempt to reach you two times.  During this call, we will ask if you have developed any symptoms of COVID 19. If you develop any symptoms (ie: fever, flu-like symptoms, shortness of breath, cough etc.) before then, please call [phone].  If you test positive for Covid 19 in the 2 weeks post procedure, please call and report this information to [place].    If any biopsies were taken you will be contacted by phone or by letter within the next 1-3 weeks.  Please call us at [phone] if you have not heard about the biopsies in 3 weeks.    SIGNATURES/CONFIDENTIALITY: You and/or your care partner have signed paperwork which will be entered into your electronic medical record.  These signatures attest to the fact that that the information above on your  After Visit Summary has been reviewed and is understood.  Full responsibility of the confidentiality of this discharge information lies with you and/or your care-partner.

## 2021-10-07 NOTE — Progress Notes (Signed)
Report given to PACU, vss 

## 2021-10-07 NOTE — Progress Notes (Signed)
HPI: This is a man with h/o polyps  Colonoscopy 2017 Dr. Ardis Hughs found three subCM polyps, all removed, two retrieved and they were TAs. Incomplete colonoscopy (("twisty colon") Dr. Lajoyce Corners 2010, followed by barium enema that should no obvious polyps or cancers.   ROS: complete GI ROS as described in HPI, all other review negative.  Constitutional:  No unintentional weight loss   Past Medical History:  Diagnosis Date   Allergy    amlodipine =rash   Hyperlipidemia    Hypertension    Prostate cancer ([place]) 06/22/11   Adenocarcinoma    Past Surgical History:  Procedure Laterality Date   PROSTATE BIOPSY  06/22/11   Adenocarcinoma   PROSTATE BIOPSY  02/07/13   2nd biopsy=Adenocarcinoma    Current Outpatient Medications  Medication Sig Dispense Refill   albuterol (PROVENTIL HFA;VENTOLIN HFA) 108 (90 Base) MCG/ACT inhaler Inhale 2 puffs into the lungs every 4 (four) hours as needed for wheezing or shortness of breath. 1 Inhaler 0   nebivolol (BYSTOLIC) 5 MG tablet Take 5 mg by mouth daily.     pravastatin (PRAVACHOL) 10 MG tablet Take 10 mg by mouth daily.     zolpidem (AMBIEN) 5 MG tablet Take 5 mg by mouth at bedtime as needed for sleep.     azithromycin (ZITHROMAX) 250 MG tablet Take two on day one and one daily thereafter. (Patient not taking: Reported on 09/23/2021) 6 tablet 0   omeprazole (PRILOSEC) 20 MG capsule Take 20 mg by mouth daily.     valACYclovir (VALTREX) 1000 MG tablet Take 1 g by mouth.     Current Facility-Administered Medications  Medication Dose Route Frequency Provider Last Rate Last Admin   0.9 %  sodium chloride infusion  500 mL Intravenous Once Milus Banister, MD        Allergies as of 10/07/2021   (No Known Allergies)    Family History  Problem Relation Age of Onset   Cancer Mother    Cancer Brother        prostate /radical prostatectomy   Colon cancer Maternal Uncle    Esophageal cancer Neg Hx    Rectal cancer Neg Hx    Stomach cancer Neg Hx      Social History   Socioeconomic History   Marital status: Single    Spouse name: Not on file   Number of children: Not on file   Years of education: Not on file   Highest education level: Not on file  Occupational History    Comment: Camera operator  Tobacco Use   Smoking status: Never   Smokeless tobacco: Never  Vaping Use   Vaping Use: Never used  Substance and Sexual Activity   Alcohol use: Yes    Comment: beer  4-5 12 ounce cans daily   Drug use: No   Sexual activity: Yes  Other Topics Concern   Not on file  Social History Narrative   Not on file   Social Determinants of Health   Financial Resource Strain: Not on file  Food Insecurity: Not on file  Transportation Needs: Not on file  Physical Activity: Not on file  Stress: Not on file  Social Connections: Not on file  Intimate Partner Violence: Not on file     Physical Exam: BP (!) 162/82   Pulse 79   Temp (!) 97.3 F (36.3 C)   Ht 5\' 8"  (1.727 m)   Wt 200 lb (90.7 kg)   BMI 30.41 kg/m  Constitutional: generally well-appearing  Psychiatric: alert and oriented x3 Lungs: CTA bilaterally Heart: no MCR  Assessment and plan: 65 y.o. male with h/o polyps  Surveillance colonoscopy today  Care is appropriate for the ambulatory setting.  Jose Loffler, MD [facility] 10/07/2021, 9:05 AM

## 2021-10-07 NOTE — Progress Notes (Signed)
Called to room to assist during endoscopic procedure.  Patient ID and intended procedure confirmed with present staff. Received instructions for my participation in the procedure from the performing physician.  

## 2021-10-07 NOTE — Progress Notes (Signed)
Pt's states no medical or surgical changes since previsit or office visit. VS by DT. °

## 2021-10-07 NOTE — Op Note (Signed)
[facility] Patient Name: Jose Calderon Procedure Date: 10/07/2021 9:02 AM MRN: [id number] Endoscopist: Milus Banister , MD Age: 65 Referring MD:  Date of Birth: 1956-09-29 Gender: Male Account #: [id number] Procedure:                Colonoscopy Indications:              High risk colon cancer surveillance: Personal                            history of colonic polyps; Colonoscopy 2017 Dr.                            Ardis Hughs found three subCM polyps, all removed, two                            retrieved and they were TAs. Incomplete colonoscopy                            (("twisty colon") Dr. Lajoyce Corners 2010, followed by barium                            enema that should no obvious polyps or cancers Medicines:                Monitored Anesthesia Care Procedure:                Pre-Anesthesia Assessment:                           - Prior to the procedure, a History and Physical                            was performed, and patient medications and                            allergies were reviewed. The patient's tolerance of                            previous anesthesia was also reviewed. The risks                            and benefits of the procedure and the sedation                            options and risks were discussed with the patient.                            All questions were answered, and informed consent                            was obtained. Prior Anticoagulants: The patient has                            taken no previous anticoagulant or antiplatelet  agents. ASA Grade Assessment: II - A patient with                            mild systemic disease. After reviewing the risks                            and benefits, the patient was deemed in                            satisfactory condition to undergo the procedure.                           After obtaining informed consent, the colonoscope                            was passed under  direct vision. Throughout the                            procedure, the patient's blood pressure, pulse, and                            oxygen saturations were monitored continuously. The                            CF [id number] #[id number] was introduced through the anus                            and advanced to the the cecum, identified by                            appendiceal orifice and ileocecal valve. The                            colonoscopy was performed without difficulty. The                            patient tolerated the procedure well. The quality                            of the bowel preparation was good. The ileocecal                            valve, appendiceal orifice, and rectum were                            photographed. Scope In: 9:12:40 AM Scope Out: 9:25:32 AM Scope Withdrawal Time: 0 hours 7 minutes 40 seconds  Total Procedure Duration: 0 hours 12 minutes 52 seconds  Findings:                 Four sessile polyps were found in the descending                            colon, transverse colon and ascending colon. The  polyps were 2 to 5 mm in size. These polyps were                            removed with a cold snare. Resection and retrieval                            were complete.                           Multiple small and large-mouthed diverticula were                            found in the entire colon. In the left colon this                            was associated with significant mucosal edema,                            tortuosity and scattered luminal narrowing.                           The exam was otherwise without abnormality on                            direct and retroflexion views. Complications:            No immediate complications. Estimated blood loss:                            None. Estimated Blood Loss:     Estimated blood loss: none. Impression:               - Four 2 to 5 mm polyps in the descending colon, in                             the transverse colon and in the ascending colon,                            removed with a cold snare. Resected and retrieved.                           - Diverticulosis in the entire examined colon.                           - The examination was otherwise normal on direct                            and retroflexion views. Recommendation:           - Patient has a contact number available for                            emergencies. The signs and symptoms of potential  delayed complications were discussed with the                            patient. Return to normal activities tomorrow.                            Written discharge instructions were provided to the                            patient.                           - Resume previous diet.                           - Continue present medications.                           - Await pathology results. Milus Banister, MD 10/07/2021 9:28:36 AM This report has been signed electronically.

## 2021-10-09 ENCOUNTER — Telehealth: Payer: Self-pay | Admitting: *Deleted

## 2021-10-09 NOTE — Telephone Encounter (Signed)
  Follow up Call-  Call back number 10/07/2021  Post procedure Call Back phone  # [phone]  Permission to leave phone message Yes  Some recent data might be hidden     Patient questions:  Do you have a fever, pain , or abdominal swelling? No. Pain Score  0 *  Have you tolerated food without any problems? Yes.    Have you been able to return to your normal activities? Yes.    Do you have any questions about your discharge instructions: Diet   No. Medications  No. Follow up visit  No.  Do you have questions or concerns about your Care? No.  Actions: * If pain score is 4 or above: No action needed, pain <4.  Have you developed a fever since your procedure? no  2.   Have you had an respiratory symptoms (SOB or cough) since your procedure? noi  3.   Have you tested positive for COVID 19 since your procedure no  4.   Have you had any family members/close contacts diagnosed with the COVID 19 since your procedure?  no   If yes to any of these questions please route to Joylene Jabree, RN and Joella Prince, RN

## 2021-10-10 ENCOUNTER — Encounter: Payer: Self-pay | Admitting: Gastroenterology

## 2022-09-01 ENCOUNTER — Encounter (HOSPITAL_COMMUNITY): Payer: Self-pay

## 2022-09-01 ENCOUNTER — Ambulatory Visit (HOSPITAL_COMMUNITY)
Admission: EM | Admit: 2022-09-01 | Discharge: 2022-09-01 | Disposition: A | Discharge status: Home / Self Care | Payer: Medicare Other | Attending: Emergency Medicine | Admitting: Emergency Medicine

## 2022-09-01 DIAGNOSIS — S8992XA Unspecified injury of left lower leg, initial encounter: Secondary | ICD-10-CM | POA: Diagnosis not present

## 2022-09-01 MED ORDER — IBUPROFEN 800 MG PO TABS
800.0000 mg | ORAL_TABLET | Freq: Once | ORAL | Status: AC
  Administered 2022-09-01: 800 mg via ORAL

## 2022-09-01 MED ORDER — IBUPROFEN 800 MG PO TABS
ORAL_TABLET | ORAL | Status: AC
  Filled 2022-09-01: qty 1

## 2022-09-01 NOTE — ED Provider Notes (Signed)
[facility]    CSN: [id number] Arrival date & time: 09/01/22  1319      History   Chief Complaint Chief Complaint  Patient presents with   Leg Injury    HPI Jose Calderon is a 66 y.o. male.  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 Authorizing Provider  albuterol (PROVENTIL HFA;VENTOLIN HFA) 108 (90 Base) MCG/ACT inhaler Inhale 2 puffs into the lungs every 4 (four) hours as needed for wheezing or shortness of breath. 10/14/17   Tereasa Coop, PA-C  azithromycin (ZITHROMAX) 250 MG tablet Take two on day one and one daily thereafter. Patient not taking: Reported on 09/23/2021 10/14/17   Tereasa Coop, PA-C  nebivolol (BYSTOLIC) 5 MG tablet Take 5 mg by mouth daily.    [provider]  omeprazole (PRILOSEC) 20 MG capsule Take 20 mg by mouth daily.    [provider]  pravastatin (PRAVACHOL) 10 MG tablet Take 10 mg by mouth daily.    [provider]  valACYclovir (VALTREX) 1000 MG tablet Take 1 g by mouth.    [provider]  zolpidem (AMBIEN) 5 MG tablet Take 5 mg by mouth at bedtime as needed for sleep.    [provider]    Family History Family History  Problem Relation Age of Onset   Cancer Mother    Cancer Brother         prostate /radical prostatectomy   Colon cancer Maternal Uncle    Esophageal cancer Neg Hx    Rectal cancer Neg Hx    Stomach cancer Neg Hx     Social History Social History   Tobacco Use   Smoking status: Never   Smokeless tobacco: Never  Vaping Use   Vaping Use: Never used  Substance Use Topics   Alcohol use: Yes    Comment: beer  4-5 12 ounce cans daily   Drug use: No     Allergies   Patient has no known allergies.   Review of Systems Review of Systems Per HPI  Physical Exam Triage Vital Signs ED Triage Vitals  Enc Vitals Group     BP 09/01/22 1436 (!) 161/75     Pulse Rate 09/01/22 1436 97     Resp 09/01/22 1436 18     Temp 09/01/22 1436 98.8 F (37.1 C)     Temp Source 09/01/22 1436 Oral     SpO2 09/01/22 1436 99 %     Weight --      Height --      Head Circumference --      Peak Flow --      Pain Score 09/01/22 1437 4  Pain Loc --      Pain Edu? --      Excl. in [place]? --    No data found.  Updated Vital Signs BP (!) 161/75 (BP Location: Left Arm)   Pulse 97   Temp 98.8 F (37.1 C) (Oral)   Resp 18   SpO2 99%    Physical Exam Vitals and nursing note reviewed.  Constitutional:      General: He is not in acute distress. HENT:     Mouth/Throat:     Pharynx: Oropharynx is clear.  Cardiovascular:     Rate and Rhythm: Normal rate and regular rhythm.     Pulses: Normal pulses.  Pulmonary:     Effort: Pulmonary effort is normal.  Musculoskeletal:        General: Tenderness present. Normal range of motion.       Legs:     Comments: Some pain with palpation in this area. Full ROM but with pain. Can bear weight. No obvious deformity of muscles  Neurological:     Mental Status: He is alert and oriented to person, place, and time.     Comments: Distal sensation intact. Strong pedal pulse     UC Treatments / Results  Labs (all labs ordered are listed, but only abnormal results are displayed) Labs Reviewed - No data to  display  EKG   Radiology No results found.  Procedures Procedures   Medications Ordered in UC Medications  ibuprofen (ADVIL) tablet 800 mg (800 mg Oral Given 09/01/22 1504)    Initial Impression / Assessment and Plan / UC Course  I have reviewed the triage vital signs and the nursing notes.  Pertinent labs & imaging results that were available during my care of the patient were reviewed by me and considered in my medical decision making (see chart for details).  Discussed obtaining xray to rule out bony abnormality, patient would like to wait and have MRI done with ortho He has appointment but not for 3 weeks  Ibuprofen dose given for pain Provided knee sleeve for support Patient would like to ice and elevate at home, would like to leave after ibuprofen given.  Discussed RICE therapy Provided [facility] information for follow up as needed Return precautions discussed. Patient agrees to plan Able to ambulate out of clinic  Final Clinical Impressions(s) / UC Diagnoses   Final diagnoses:  Injury of left lower extremity, initial encounter     Discharge Instructions      Rest - try to avoid heavy lifting and high impact activity Ice - apply for 20 minutes a few times daily Compression - use knee sleeve as needed for walking/standing Elevation - prop up on a pillow  Ibuprofen 800 mg given today. You can repeat dose in 6 hours if needed.  I recommend follow up with orthopedics.      ED Prescriptions   None    PDMP not reviewed this encounter.   Les Pou, [place] 09/01/22 1514

## 2022-09-01 NOTE — ED Triage Notes (Signed)
Pt slipped getting out of a truck this morning and pulled something to lt calf. C/o pain when moving leg back. Denies taking meds.

## 2022-09-01 NOTE — Discharge Instructions (Addendum)
Rest - try to avoid heavy lifting and high impact activity Ice - apply for 20 minutes a few times daily Compression - use knee sleeve as needed for walking/standing Elevation - prop up on a pillow  Ibuprofen 800 mg given today. You can repeat dose in 6 hours if needed.  I recommend follow up with orthopedics.

## 2022-09-23 ENCOUNTER — Ambulatory Visit (INDEPENDENT_AMBULATORY_CARE_PROVIDER_SITE_OTHER): Payer: Medicare Other

## 2022-09-23 ENCOUNTER — Ambulatory Visit (INDEPENDENT_AMBULATORY_CARE_PROVIDER_SITE_OTHER): Payer: Medicare Other | Admitting: Surgical

## 2022-09-23 DIAGNOSIS — M25562 Pain in left knee: Secondary | ICD-10-CM | POA: Diagnosis not present

## 2022-09-23 NOTE — Progress Notes (Signed)
Office Visit Note   Patient: Jose Calderon           Date of Birth: [birth year]           MRN: [id number] Visit Date: 09/23/2022 Requested by: Jani Gravel, [place],  [place] [address] PCP: Jani Gravel, MD  Subjective: Chief Complaint  Patient presents with   Left Leg - Pain    HPI: Jose Calderon is a 66 y.o. male who presents to the office reporting left knee pain.  He got off from his truck bed about 2 to 3 weeks ago.  His left leg slipped and he landed onto his lateral left knee causing increased pain.  At the time he had pain waking him up from sleep at night for several days that required him taking ibuprofen.  He has not really had any history of prior knee problem or any history of left leg surgery.  Denies any groin pain or buttock pain.  Localizes the majority of his pain to the lateral aspect of the left knee and proximal calf as well as some pain in the posterior mid hamstring region.  No mechanical symptoms or locking symptoms that is new.  Pain is 80% better today compared with time of injury.  He still feels like he is making improvements.  He mostly wants to know if he is able to go back to exercising at the gym which mostly involves him lifting weights and riding a stationary bike..                ROS: All systems reviewed are negative as they relate to the chief complaint within the history of present illness.  Patient denies fevers or chills.  Assessment & Plan: Visit Diagnoses:  1. Acute pain of left knee     Plan: Patient is a 66 year old male who presents for evaluation of left knee pain.  He has history of left knee pain stemming from injury where he stepped down awkwardly from his truck bed and landed on the lateral aspect of his left knee.  80% improvement of symptoms since that event about 3 weeks ago.  Radiographs taken today demonstrate no significant acute osseous abnormality or any significant degenerative changes of the left knee.  Plan  is to trial returning to exercise program.  He will ease back into exercise program and make an appointment for 4 weeks to follow-up.  If he has 100% improvement of all symptoms at that point he may call and cancel his appointment.  Follow-Up Instructions: No follow-ups on file.   Orders:  Orders Placed This Encounter  Procedures   XR KNEE 3 VIEW LEFT   No orders of the defined types were placed in this encounter.     Procedures: No procedures performed   Clinical Data: No additional findings.  Objective: Vital Signs: There were no vitals taken for this visit.  Physical Exam:  Constitutional: Patient appears well-developed HEENT:  Head: Normocephalic Eyes:EOM are normal Neck: Normal range of motion Cardiovascular: Normal rate Pulmonary/chest: Effort normal Neurologic: Patient is alert Skin: Skin is warm Psychiatric: Patient has normal mood and affect  Ortho Exam: Ortho exam demonstrates left knee without effusion.  Tenderness over the medial and lateral joint lines mildly.  He has excellent stability to anterior posterior drawer sign.  Stable to varus and valgus stress at 0 and 30 degrees.  He has no ecchymosis or swelling that is visible.  Excellent hamstring and quadricep strength  rated 5/5.  No pain with hip range of motion.  Intact ankle dorsiflexion and plantarflexion.  No calf tenderness.  He does have a mild amount of tenderness over the pes anserine bursa which is little bit increased compared with contralateral side.  Specialty Comments:  No specialty comments available.  Imaging: No results found.   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/22/11   Adenocarcinoma    Family History  Problem Relation Age of Onset   Cancer Mother    Cancer Brother        prostate  /radical prostatectomy   Colon cancer Maternal Uncle    Esophageal cancer Neg Hx    Rectal cancer Neg Hx    Stomach cancer Neg Hx     Past Surgical History:  Procedure Laterality Date   PROSTATE BIOPSY  06/22/11   Adenocarcinoma   PROSTATE BIOPSY  02/07/13   2nd biopsy=Adenocarcinoma   Social History   Occupational History    Comment: Camera operator  Tobacco Use   Smoking status: Never   Smokeless tobacco: Never  Vaping Use   Vaping Use: Never used  Substance and Sexual Activity   Alcohol use: Yes    Comment: beer  4-5 12 ounce cans daily   Drug use: No   Sexual activity: Yes

## 2022-09-25 ENCOUNTER — Encounter: Payer: Self-pay | Admitting: Orthopedic Surgery

## 2022-10-21 ENCOUNTER — Ambulatory Visit: Payer: BLUE CROSS/BLUE SHIELD | Admitting: Surgical

## 2023-03-08 ENCOUNTER — Ambulatory Visit (INDEPENDENT_AMBULATORY_CARE_PROVIDER_SITE_OTHER): Payer: Medicare Other | Admitting: Surgical

## 2023-03-08 ENCOUNTER — Other Ambulatory Visit (INDEPENDENT_AMBULATORY_CARE_PROVIDER_SITE_OTHER): Payer: Medicare Other

## 2023-03-08 DIAGNOSIS — S6991XA Unspecified injury of right wrist, hand and finger(s), initial encounter: Secondary | ICD-10-CM | POA: Diagnosis not present

## 2023-03-08 NOTE — Progress Notes (Unsigned)
   Office Visit Note   Patient: Jose Calderon           Date of Birth: [birth year]           MRN: [id number] Visit Date: 03/08/2023 Requested by: Pearson Grippe, MD [address] [place],  [place] [address] PCP: Pearson Grippe, MD  Subjective: Chief Complaint  Patient presents with   Right Hand - Injury    Right pinky finger injury x 4 wks ago    HPI: Jose Calderon is a 67 y.o. male who presents to the office reporting right hand small finger pain.  Patient states that 4 weeks ago he was roughhousing with his nephew when he sustained sudden injury to his small finger of his right hand.  He had difficulty.                ROS: All systems reviewed are negative as they relate to the chief complaint within the history of present illness.  Patient denies fevers or chills.  Assessment & Plan: Visit Diagnoses:  1. Finger injury, right, initial encounter     Plan: ***  Follow-Up Instructions: No follow-ups on file.   Orders:  Orders Placed This Encounter  Procedures   XR Finger Little Right   No orders of the defined types were placed in this encounter.     Procedures: No procedures performed   Clinical Data: No additional findings.  Objective: Vital Signs: There were no vitals taken for this visit.  Physical Exam:  Constitutional: Patient appears well-developed HEENT:  Head: Normocephalic Eyes:EOM are normal Neck: Normal range of motion Cardiovascular: Normal rate Pulmonary/chest: Effort normal Neurologic: Patient is alert Skin: Skin is warm Psychiatric: Patient has normal mood and affect  Ortho Exam: ***  Specialty Comments:  No specialty comments available.  Imaging: No results found.   PMFS History: Patient Active Problem List   Diagnosis Date Noted   Prostate cancer ([facility]) 06/22/2011   Past Medical History:  Diagnosis Date   Allergy    amlodipine =rash   Hyperlipidemia    Hypertension    Prostate cancer ([facility]) 06/22/11   Adenocarcinoma     Family History  Problem Relation Age of Onset   Cancer Mother    Cancer Brother        prostate /radical prostatectomy   Colon cancer Maternal Uncle    Esophageal cancer Neg Hx    Rectal cancer Neg Hx    Stomach cancer Neg Hx     Past Surgical History:  Procedure Laterality Date   PROSTATE BIOPSY  06/22/11   Adenocarcinoma   PROSTATE BIOPSY  02/07/13   2nd biopsy=Adenocarcinoma   Social History   Occupational History    Comment: Chartered loss adjuster  Tobacco Use   Smoking status: Never   Smokeless tobacco: Never  Vaping Use   Vaping Use: Never used  Substance and Sexual Activity   Alcohol use: Yes    Comment: beer  4-5 12 ounce cans daily   Drug use: No   Sexual activity: Yes

## 2023-03-09 ENCOUNTER — Encounter: Payer: Self-pay | Admitting: Surgical

## 2023-03-31 ENCOUNTER — Encounter: Payer: Self-pay | Admitting: Surgical

## 2023-03-31 ENCOUNTER — Ambulatory Visit (INDEPENDENT_AMBULATORY_CARE_PROVIDER_SITE_OTHER): Payer: Medicare Other | Admitting: Surgical

## 2023-03-31 DIAGNOSIS — S6991XA Unspecified injury of right wrist, hand and finger(s), initial encounter: Secondary | ICD-10-CM

## 2023-03-31 NOTE — Progress Notes (Signed)
Jose Calderon comes in today just to recheck on making sure he got the appropriate brace for his mallet finger.  Everything looks appropriate.  He has extension splint of the affected finger with no complaints.  He is tolerating this well.  He will stay in this for another 5 weeks and come back to see Dr. August Saucer at that point and we can transition to nighttime splinting at that time.  No charge for this brief check-in today.

## 2023-05-12 ENCOUNTER — Encounter: Payer: Self-pay | Admitting: Orthopedic Surgery

## 2023-05-12 ENCOUNTER — Ambulatory Visit (INDEPENDENT_AMBULATORY_CARE_PROVIDER_SITE_OTHER): Payer: Medicare Other | Admitting: Orthopedic Surgery

## 2023-05-12 DIAGNOSIS — S6991XA Unspecified injury of right wrist, hand and finger(s), initial encounter: Secondary | ICD-10-CM

## 2023-05-12 NOTE — Progress Notes (Signed)
   Office Visit Note   Patient: Jose Calderon           Date of Birth: [birth year]           MRN: [id number] Visit Date: 05/12/2023 Requested by: Pearson Grippe, MD [address] [place],  [place] [address] PCP: Pearson Grippe, MD  Subjective: Chief Complaint  Patient presents with   Other     Follow up mallet finger injury    HPI: Jose Calderon is a 67 y.o. male who presents to the office reporting right mallet finger injury.  He has been full-time in a custom splint for the past 8 weeks.  He received the splint about 1 month after his injury.  His measured contracture has diminished to 22 degrees from 50 degrees..                ROS: All systems reviewed are negative as they relate to the chief complaint within the history of present illness.  Patient denies fevers or chills.  Assessment & Plan: Visit Diagnoses:  1. Finger injury, right, initial encounter     Plan: Impression is mallet finger which has improved with bracing.  Still has enough of the deformity that I think would be good to do 3 weeks of full-time bracing and 3 weeks of nighttime bracing.  6-week return for final clinical recheck and release.  Follow-Up Instructions: No follow-ups on file.   Orders:  No orders of the defined types were placed in this encounter.  No orders of the defined types were placed in this encounter.     Procedures: No procedures performed   Clinical Data: No additional findings.  Objective: Vital Signs: There were no vitals taken for this visit.  Physical Exam:  Constitutional: Patient appears well-developed HEENT:  Head: Normocephalic Eyes:EOM are normal Neck: Normal range of motion Cardiovascular: Normal rate Pulmonary/chest: Effort normal Neurologic: Patient is alert Skin: Skin is warm Psychiatric: Patient has normal mood and affect  Ortho Exam: Ortho exam demonstrates slight deformity in that right fifth finger.  Passively correctable.  Flexion not tested.  No  other secondary deformities noted in that right small ring finger.  Specialty Comments:  No specialty comments available.  Imaging: No results found.   PMFS History: Patient Active Problem List   Diagnosis Date Noted   Prostate cancer ([facility]) 06/22/2011   Past Medical History:  Diagnosis Date   Allergy    amlodipine =rash   Hyperlipidemia    Hypertension    Prostate cancer ([facility]) 06/22/11   Adenocarcinoma    Family History  Problem Relation Age of Onset   Cancer Mother    Cancer Brother        prostate /radical prostatectomy   Colon cancer Maternal Uncle    Esophageal cancer Neg Hx    Rectal cancer Neg Hx    Stomach cancer Neg Hx     Past Surgical History:  Procedure Laterality Date   PROSTATE BIOPSY  06/22/11   Adenocarcinoma   PROSTATE BIOPSY  02/07/13   2nd biopsy=Adenocarcinoma   Social History   Occupational History    Comment: Chartered loss adjuster  Tobacco Use   Smoking status: Never   Smokeless tobacco: Never  Vaping Use   Vaping Use: Never used  Substance and Sexual Activity   Alcohol use: Yes    Comment: beer  4-5 12 ounce cans daily   Drug use: No   Sexual activity: Yes

## 2023-06-23 ENCOUNTER — Ambulatory Visit (INDEPENDENT_AMBULATORY_CARE_PROVIDER_SITE_OTHER): Payer: Medicare Other | Admitting: Orthopedic Surgery

## 2023-06-23 ENCOUNTER — Encounter: Payer: Self-pay | Admitting: Orthopedic Surgery

## 2023-06-23 DIAGNOSIS — S6991XA Unspecified injury of right wrist, hand and finger(s), initial encounter: Secondary | ICD-10-CM | POA: Diagnosis not present

## 2023-06-23 NOTE — Progress Notes (Signed)
Office Visit Note   Patient: Jose Calderon           Date of Birth: 1955/12/12           MRN: [id number] Visit Date: 06/23/2023 Requested by: Pearson Grippe, MD [address] [place],  [place] [address] PCP: Pearson Grippe, MD  Subjective: Chief Complaint  Patient presents with   Other    Follow up finger injury    HPI: [name] is a 67 y.o. male who presents to the office reporting right finger pain.  Injury occurred in April and we saw him 4 weeks after the injury.  Initiated splinting process.  Has been splinting for the past 12 weeks..                ROS: All systems reviewed are negative as they relate to the chief complaint within the history of present illness.  Patient denies fevers or chills.  Assessment & Plan: Visit Diagnoses:  1. Finger injury, right, initial encounter     Plan: Impression is some residual deformity following right fifth mallet finger.  Does not have great extension against resistance.  Passive range of motion okay.  PIP joint predictably stiff but that is improving.  Talked about operative and nonoperative treatment for this problem but he wants to just keep working on getting more motion back in the finger.  Swan-neck deformity could develop but for now he is going to live with what he has.  We will see him back as needed.  Follow-Up Instructions: No follow-ups on file.   Orders:  No orders of the defined types were placed in this encounter.  No orders of the defined types were placed in this encounter.     Procedures: No procedures performed   Clinical Data: No additional findings.  Objective: Vital Signs: There were no vitals taken for this visit.  Physical Exam:  Constitutional: Patient appears well-developed HEENT:  Head: Normocephalic Eyes:EOM are normal Neck: Normal range of motion Cardiovascular: Normal rate Pulmonary/chest: Effort normal Neurologic: Patient is alert Skin: Skin is warm Psychiatric: Patient has  normal mood and affect  Ortho Exam: Ortho exam demonstrates about a 30 degree extension lag on that right fifth finger.  No active extension at that point.  PIP is slightly stiff but improving and has about 70 degrees of flexion.  Specialty Comments:  No specialty comments available.  Imaging: No results found.   PMFS History: Patient Active Problem List   Diagnosis Date Noted   Prostate cancer ([facility]) 06/22/2011   Past Medical History:  Diagnosis Date   Allergy    amlodipine =rash   Hyperlipidemia    Hypertension    Prostate cancer ([facility]) 06/22/11   Adenocarcinoma    Family History  Problem Relation Age of Onset   Cancer Mother    Cancer Brother        prostate /radical prostatectomy   Colon cancer Maternal Uncle    Esophageal cancer Neg Hx    Rectal cancer Neg Hx    Stomach cancer Neg Hx     Past Surgical History:  Procedure Laterality Date   PROSTATE BIOPSY  06/22/11   Adenocarcinoma   PROSTATE BIOPSY  02/07/13   2nd biopsy=Adenocarcinoma   Social History   Occupational History    Comment: Chartered loss adjuster  Tobacco Use   Smoking status: Never   Smokeless tobacco: Never  Vaping Use   Vaping status: Never Used  Substance and Sexual Activity  Alcohol use: Yes    Comment: beer  4-5 12 ounce cans daily   Drug use: No   Sexual activity: Yes

## 2024-11-02 ENCOUNTER — Emergency Department (HOSPITAL_BASED_OUTPATIENT_CLINIC_OR_DEPARTMENT_OTHER)

## 2024-11-02 ENCOUNTER — Encounter (HOSPITAL_COMMUNITY): Payer: Self-pay

## 2024-11-02 ENCOUNTER — Ambulatory Visit (HOSPITAL_COMMUNITY): Admission: EM | Admit: 2024-11-02 | Discharge: 2024-11-02 | Disposition: A | Discharge status: Home / Self Care

## 2024-11-02 ENCOUNTER — Encounter (HOSPITAL_BASED_OUTPATIENT_CLINIC_OR_DEPARTMENT_OTHER): Payer: Self-pay

## 2024-11-02 ENCOUNTER — Other Ambulatory Visit: Payer: Self-pay

## 2024-11-02 ENCOUNTER — Emergency Department (HOSPITAL_BASED_OUTPATIENT_CLINIC_OR_DEPARTMENT_OTHER): Admission: EM | Admit: 2024-11-02 | Discharge: 2024-11-02 | Disposition: A | Discharge status: Home / Self Care

## 2024-11-02 DIAGNOSIS — R42 Dizziness and giddiness: Secondary | ICD-10-CM | POA: Insufficient documentation

## 2024-11-02 DIAGNOSIS — I10 Essential (primary) hypertension: Secondary | ICD-10-CM | POA: Insufficient documentation

## 2024-11-02 DIAGNOSIS — Z79899 Other long term (current) drug therapy: Secondary | ICD-10-CM | POA: Insufficient documentation

## 2024-11-02 LAB — URINALYSIS, ROUTINE W REFLEX MICROSCOPIC
Bilirubin Urine: NEGATIVE
Glucose, UA: NEGATIVE mg/dL
Hgb urine dipstick: NEGATIVE
Ketones, ur: NEGATIVE mg/dL
Leukocytes,Ua: NEGATIVE
Nitrite: NEGATIVE
Protein, ur: NEGATIVE mg/dL
Specific Gravity, Urine: 1.01 (ref 1.005–1.030)
pH: 7 (ref 5.0–8.0)

## 2024-11-02 LAB — CBC
HCT: 43.2 % (ref 39.0–52.0)
Hemoglobin: 14.5 g/dL (ref 13.0–17.0)
MCH: 31.7 pg (ref 26.0–34.0)
MCHC: 33.6 g/dL (ref 30.0–36.0)
MCV: 94.5 fL (ref 80.0–100.0)
Platelets: 217 K/uL (ref 150–400)
RBC: 4.57 MIL/uL (ref 4.22–5.81)
RDW: 12.6 % (ref 11.5–15.5)
WBC: 8.7 K/uL (ref 4.0–10.5)
nRBC: 0 % (ref 0.0–0.2)

## 2024-11-02 LAB — COMPREHENSIVE METABOLIC PANEL WITH GFR
ALT: 24 U/L (ref 0–44)
AST: 30 U/L (ref 15–41)
Albumin: 4.5 g/dL (ref 3.5–5.0)
Alkaline Phosphatase: 43 U/L (ref 38–126)
Anion gap: 15 (ref 5–15)
BUN: 17 mg/dL (ref 8–23)
CO2: 22 mmol/L (ref 22–32)
Calcium: 9.6 mg/dL (ref 8.9–10.3)
Chloride: 97 mmol/L — ABNORMAL LOW (ref 98–111)
Creatinine, Ser: 0.77 mg/dL (ref 0.61–1.24)
GFR, Estimated: >60 mL/min
Glucose, Bld: 101 mg/dL — ABNORMAL HIGH (ref 70–99)
Potassium: 4 mmol/L (ref 3.5–5.1)
Sodium: 134 mmol/L — ABNORMAL LOW (ref 135–145)
Total Bilirubin: 0.5 mg/dL (ref 0.0–1.2)
Total Protein: 7.8 g/dL (ref 6.5–8.1)

## 2024-11-02 LAB — CBG MONITORING, ED: Glucose-Capillary: 99 mg/dL (ref 70–99)

## 2024-11-02 MED ORDER — IOHEXOL 350 MG/ML SOLN
75.0000 mL | Freq: Once | INTRAVENOUS | Status: AC | PRN
  Administered 2024-11-02: 75 mL via INTRAVENOUS

## 2024-11-02 MED ORDER — SODIUM CHLORIDE 0.9 % IV BOLUS
1000.0000 mL | Freq: Once | INTRAVENOUS | Status: AC
  Administered 2024-11-02: 1000 mL via INTRAVENOUS

## 2024-11-02 MED ORDER — MECLIZINE HCL 25 MG PO TABS: 25.0000 mg | ORAL_TABLET | Freq: Three times a day (TID) | ORAL | 0 refills | Status: AC | PRN

## 2024-11-02 MED ORDER — MECLIZINE HCL 25 MG PO TABS
25.0000 mg | ORAL_TABLET | Freq: Once | ORAL | Status: AC
  Administered 2024-11-02: 25 mg via ORAL
  Filled 2024-11-02: qty 1

## 2024-11-02 NOTE — ED Triage Notes (Addendum)
 Patient reports that he got up in the middle of the night last night and passed out for one second. Patienat added that he had a slight headache and nausea.  Patient states he took his BP after taking an extra dose of Nebivolol 5 mg and BP was still 170/91.

## 2024-11-02 NOTE — ED Triage Notes (Addendum)
 Patient reports visiting urgent care for hypertension and headache. They encouraged him to come here for further evaluation. He takes his blood pressure medication but it is still elevated despite his normal doses. He took a second dose to help, but it still remained elevated.   Upon further investigation, he also reports he had a near syncopal event last night and has had some dizziness throughout today.

## 2024-11-02 NOTE — ED Notes (Signed)
 Patient is being discharged from the Urgent Care and sent to the Emergency Department via POV . Per Manuelita Search, PA, patient is in need of higher level of care due to hypertension and dizziness. Patient is aware and verbalizes understanding of plan of care.  Vitals:   11/02/24 1718  BP: (!) 185/95  Pulse: 69  Resp: 16  Temp: 97.9 F (36.6 C)  SpO2: 100%

## 2024-11-02 NOTE — Discharge Instructions (Signed)
 Take Meclizine as directed for symptoms of dizziness. If you develop a severe headache, have any passing out episodes, uncontrolled vomiting or new concern, please return to the ED.   Please follow up with your primary care doctor for recheck in one week to ensure symptoms are improving.

## 2024-11-02 NOTE — ED Provider Notes (Signed)
 " [place] EMERGENCY DEPARTMENT AT [facility] Provider Note   CSN: [id number] Arrival date & time: 11/02/24  1753     Patient presents with: Hypertension   Jose Calderon is a 69 y.o. male.   Patient to ED for evaluation of symptoms of dizziness like the room is spinning that started when he got up during the night last night. He felt unsteady while walking. No falls. He reports he had to lean against the wall to remain steady. There was mild nausea without vomiting. And he reports a mild left sided headache that has improved over time. He reports he was concerned because his blood pressure was elevated at home (170/100) on multiple readings and went to Urgent Care who sent him to the ED for further evaluation.  No chest pain, SOB. No recent fever, cough, congestion. He states he took an extra dose of his Bistolic today with no affect on his pressure.   The history is provided by the patient. No language interpreter was used.  Hypertension       Prior to Admission medications  Medication Sig Start Date End Date Taking? Authorizing Provider  meclizine  (ANTIVERT ) 25 MG tablet Take 1 tablet (25 mg total) by mouth 3 (three) times daily as needed for dizziness. 11/02/24  Yes Rowyn Spilde, Margit, PA-C  albuterol  (PROVENTIL  HFA;VENTOLIN  HFA) 108 (90 Base) MCG/ACT inhaler Inhale 2 puffs into the lungs every 4 (four) hours as needed for wheezing or shortness of breath. Patient not taking: Reported on 11/02/2024 10/14/17   Gretta Ozell CROME, PA-C  azithromycin  (ZITHROMAX ) 250 MG tablet Take two on day one and one daily thereafter. Patient not taking: Reported on 09/23/2021 10/14/17   Gretta Ozell CROME, PA-C  nebivolol (BYSTOLIC) 5 MG tablet Take 5 mg by mouth daily.    [provider]  omeprazole (PRILOSEC) 20 MG capsule Take 20 mg by mouth daily.    [provider]  pravastatin (PRAVACHOL) 10 MG tablet Take 10 mg by mouth daily.    [provider]  valACYclovir  (VALTREX) 1000 MG tablet Take 1 g by mouth. Patient not taking: Reported on 11/02/2024    [provider]  zolpidem (AMBIEN) 5 MG tablet Take 5 mg by mouth at bedtime as needed for sleep.    [provider]    Allergies: Patient has no known allergies.    Review of Systems  Updated Vital Signs BP (!) 155/88 (BP Location: Left Arm)   Pulse 60   Temp 97.8 F (36.6 C) (Oral)   Resp 16   SpO2 99%   Physical Exam  (all labs ordered are listed, but only abnormal results are displayed) Labs Reviewed  COMPREHENSIVE METABOLIC PANEL WITH GFR - Abnormal; Notable for the following components:      Result Value   Sodium 134 (*)    Chloride 97 (*)    Glucose, Bld 101 (*)    All other components within normal limits  URINALYSIS, ROUTINE W REFLEX MICROSCOPIC - Abnormal; Notable for the following components:   Color, Urine STRAW (*)    All other components within normal limits  CBC  CBG MONITORING, ED    EKG: EKG Interpretation Date/Time:  Thursday November 02 2024 18:03:13 EST Ventricular Rate:  72 PR Interval:  174 QRS Duration:  94 QT Interval:  392 QTC Calculation: 429 R Axis:   -11  Text Interpretation: Sinus rhythm No previous EKG for comparison Confirmed by Gennaro Bouchard ([id number]) on 11/02/2024 7:45:44 PM  Radiology:  CT ANGIO HEAD NECK W WO CM Result Date: 11/02/2024 EXAM: CTA HEAD AND NECK WITHOUT AND WITH 11/02/2024 07:41:53 PM TECHNIQUE: CTA of the head and neck was performed without and with the administration of 75 mL of iohexol  (OMNIPAQUE ) 350 MG/ML injection. Multiplanar 2D and/or 3D reformatted images are provided for review. Automated exposure control, iterative reconstruction, and/or weight based adjustment of the mA/kV was utilized to reduce the radiation dose to as low as reasonably achievable. Stenosis of the internal carotid arteries measured using NASCET criteria. COMPARISON: None available CLINICAL HISTORY: Vertigo, central. Vertigo, central.  FINDINGS: CTA NECK: AORTIC ARCH AND ARCH VESSELS: No dissection or arterial injury. No significant stenosis of the brachiocephalic or subclavian arteries. CERVICAL CAROTID ARTERIES: Atherosclerosis at the left carotid bifurcation without hemodynamically significant stenosis. The right carotid artery is patent without significant stenosis or dissection. No dissection or arterial injury. CERVICAL VERTEBRAL ARTERIES: The left vertebral artery is dominant with mild focal atherosclerosis of the proximal left V2 segment resulting in mild stenosis and additional focal atherosclerosis along the left V4 segment without stenosis. The nondominant right vertebral artery terminates at the origin of the right PICA (Posterior Inferior Cerebellar Artery). No dissection or arterial injury. LUNGS AND MEDIASTINUM: Unremarkable. SOFT TISSUES: No acute abnormality. BONES: Degenerative changes at multiple levels in the visualized spine with prominent anterior endplate osteophytes at multiple levels in the cervical spine. CTA HEAD: ANTERIOR CIRCULATION: No significant stenosis of the internal carotid arteries. No significant stenosis of the anterior cerebral arteries. No significant stenosis of the middle cerebral arteries. No aneurysm. POSTERIOR CIRCULATION: The nondominant right vertebral artery terminates at the origin of the right PICA (Posterior Inferior Cerebellar Artery). The dominant left vertebral artery has additional focal atherosclerosis along the left V4 segment without stenosis. No significant stenosis of the basilar artery. No significant stenosis of the posterior cerebral arteries. No aneurysm. OTHER: Mild age related volume loss. Mild chronic microvascular ischemic changes. Chronic appearing nasal bone deformities. No dural venous sinus thrombosis on this non-dedicated study. IMPRESSION: 1. No acute intracranial abnormality. 2. No acute large vessel occlusion. 3. Mild focal atherosclerosis of the proximal V2 segment of  the left vertebral artery resulting in mild stenosis. 4. Additional focal atherosclerosis along the left V4 segment without stenosis. 5. Atherosclerosis at the left carotid bifurcation without hemodynamically significant stenosis. Electronically signed by: Donnice Mania MD 11/02/2024 07:59 PM EST RP Workstation: [id number]     Procedures   Medications Ordered in the ED  sodium chloride  0.9 % bolus 1,000 mL (0 mLs Intravenous Stopped 11/02/24 2116)  meclizine  (ANTIVERT ) tablet 25 mg (25 mg Oral Given 11/02/24 1920)  iohexol  (OMNIPAQUE ) 350 MG/ML injection 75 mL (75 mLs Intravenous Contrast Given 11/02/24 1942)    Clinical Course as of 11/02/24 2306  Thu Nov 02, 2024  1937 Patient with history of HTN, here with symptoms of vertiginous type dizziness since  last night, as further detailed in HPI. Neuro exam is nonfocal. Labs reassuring.   Discussed with dr. Gennaro. Will give fluids, meclizine , obtain CTA head and neck. Re-evaluate after. [SU]  2028 CTA head and neck per radiology:  IMPRESSION: 1. No acute intracranial abnormality. 2. No acute large vessel occlusion. 3. Mild focal atherosclerosis of the proximal V2 segment of the left vertebral artery resulting in mild stenosis. 4. Additional focal atherosclerosis along the left V4 segment without stenosis. 5. Atherosclerosis at the left carotid bifurcation without hemodynamically significant stenosis.  [SU]  2135 Patient reports he is feeling better after fluids and meclizine . CTA  negative. No further study felt needed. Discussed PCP follow up for recheck in one week. Discussed return precautions.  [SU]    Clinical Course User Index [SU] Odell Balls, PA-C                                 Medical Decision Making Amount and/or Complexity of Data Reviewed Labs: ordered. Radiology: ordered.  Risk Prescription drug management.        Final diagnoses:  Vertigo    ED Discharge Orders          Ordered    meclizine  (ANTIVERT )  25 MG tablet  3 times daily PRN        11/02/24 2105               Odell Balls, PA-C 11/02/24 2306    Kammerer, Megan L, DO 11/05/24 1359  "
